# Patient Record
Sex: Male | Born: 1955 | Race: White | Hispanic: No | Marital: Single | Smoking: Current every day smoker
Health system: Southern US, Community
[De-identification: ages and names within clinical notes are randomized; demographics above are authoritative.]

## PROBLEM LIST (undated history)

## (undated) DIAGNOSIS — N289 Disorder of kidney and ureter, unspecified: Secondary | ICD-10-CM

## (undated) DIAGNOSIS — I509 Heart failure, unspecified: Secondary | ICD-10-CM

## (undated) DIAGNOSIS — I10 Essential (primary) hypertension: Secondary | ICD-10-CM

## (undated) HISTORY — PX: TONSILLECTOMY: SUR1361

---

## 2014-01-31 ENCOUNTER — Emergency Department: Payer: Self-pay | Admitting: Emergency Medicine

## 2014-01-31 LAB — BASIC METABOLIC PANEL
Anion Gap: 10 (ref 7–16)
BUN: 14 mg/dL (ref 7–18)
CALCIUM: 7.8 mg/dL — AB (ref 8.5–10.1)
CHLORIDE: 101 mmol/L (ref 98–107)
CO2: 24 mmol/L (ref 21–32)
Creatinine: 0.81 mg/dL (ref 0.60–1.30)
EGFR (African American): >60
EGFR (Non-African Amer.): >60
Glucose: 106 mg/dL — ABNORMAL HIGH (ref 65–99)
Osmolality: 271 (ref 275–301)
Potassium: 3.5 mmol/L (ref 3.5–5.1)
SODIUM: 135 mmol/L — AB (ref 136–145)

## 2014-01-31 LAB — CBC
HCT: 39.2 % — AB (ref 40.0–52.0)
HGB: 13.1 g/dL (ref 13.0–18.0)
MCH: 26.9 pg (ref 26.0–34.0)
MCHC: 33.3 g/dL (ref 32.0–36.0)
MCV: 81 fL (ref 80–100)
Platelet: 391 10*3/uL (ref 150–440)
RBC: 4.86 10*6/uL (ref 4.40–5.90)
RDW: 14.1 % (ref 11.5–14.5)
WBC: 8.5 10*3/uL (ref 3.8–10.6)

## 2014-01-31 LAB — PRO B NATRIURETIC PEPTIDE: B-TYPE NATIURETIC PEPTID: 655 pg/mL — AB (ref 0–125)

## 2014-01-31 LAB — TROPONIN I: Troponin-I: <0.02 ng/mL

## 2018-10-30 ENCOUNTER — Other Ambulatory Visit: Payer: Self-pay

## 2018-10-30 ENCOUNTER — Inpatient Hospital Stay
Admission: EM | Admit: 2018-10-30 | Discharge: 2018-11-09 | DRG: 270 | Disposition: A | Discharge status: Skilled Nursing Facility | Payer: 59 | Attending: Internal Medicine | Admitting: Internal Medicine

## 2018-10-30 ENCOUNTER — Emergency Department: Payer: 59

## 2018-10-30 ENCOUNTER — Encounter: Payer: Self-pay | Admitting: *Deleted

## 2018-10-30 DIAGNOSIS — F17210 Nicotine dependence, cigarettes, uncomplicated: Secondary | ICD-10-CM | POA: Diagnosis present

## 2018-10-30 DIAGNOSIS — Z915 Personal history of self-harm: Secondary | ICD-10-CM | POA: Diagnosis not present

## 2018-10-30 DIAGNOSIS — F332 Major depressive disorder, recurrent severe without psychotic features: Secondary | ICD-10-CM | POA: Diagnosis not present

## 2018-10-30 DIAGNOSIS — T501X5A Adverse effect of loop [high-ceiling] diuretics, initial encounter: Secondary | ICD-10-CM | POA: Diagnosis not present

## 2018-10-30 DIAGNOSIS — Z66 Do not resuscitate: Secondary | ICD-10-CM | POA: Diagnosis present

## 2018-10-30 DIAGNOSIS — F411 Generalized anxiety disorder: Secondary | ICD-10-CM | POA: Diagnosis present

## 2018-10-30 DIAGNOSIS — I96 Gangrene, not elsewhere classified: Secondary | ICD-10-CM | POA: Diagnosis present

## 2018-10-30 DIAGNOSIS — T441X2A Poisoning by other parasympathomimetics [cholinergics], intentional self-harm, initial encounter: Secondary | ICD-10-CM | POA: Diagnosis not present

## 2018-10-30 DIAGNOSIS — Z1159 Encounter for screening for other viral diseases: Secondary | ICD-10-CM

## 2018-10-30 DIAGNOSIS — R7981 Abnormal blood-gas level: Secondary | ICD-10-CM

## 2018-10-30 DIAGNOSIS — I10 Essential (primary) hypertension: Secondary | ICD-10-CM | POA: Diagnosis not present

## 2018-10-30 DIAGNOSIS — I472 Ventricular tachycardia: Secondary | ICD-10-CM | POA: Diagnosis not present

## 2018-10-30 DIAGNOSIS — D649 Anemia, unspecified: Secondary | ICD-10-CM | POA: Diagnosis present

## 2018-10-30 DIAGNOSIS — Y92230 Patient room in hospital as the place of occurrence of the external cause: Secondary | ICD-10-CM | POA: Diagnosis not present

## 2018-10-30 DIAGNOSIS — J849 Interstitial pulmonary disease, unspecified: Secondary | ICD-10-CM | POA: Diagnosis present

## 2018-10-30 DIAGNOSIS — L03115 Cellulitis of right lower limb: Secondary | ICD-10-CM | POA: Diagnosis present

## 2018-10-30 DIAGNOSIS — Z8249 Family history of ischemic heart disease and other diseases of the circulatory system: Secondary | ICD-10-CM

## 2018-10-30 DIAGNOSIS — I110 Hypertensive heart disease with heart failure: Secondary | ICD-10-CM | POA: Diagnosis present

## 2018-10-30 DIAGNOSIS — Z882 Allergy status to sulfonamides status: Secondary | ICD-10-CM | POA: Diagnosis not present

## 2018-10-30 DIAGNOSIS — I5033 Acute on chronic diastolic (congestive) heart failure: Secondary | ICD-10-CM | POA: Diagnosis present

## 2018-10-30 DIAGNOSIS — E876 Hypokalemia: Secondary | ICD-10-CM | POA: Diagnosis not present

## 2018-10-30 DIAGNOSIS — J9601 Acute respiratory failure with hypoxia: Secondary | ICD-10-CM | POA: Diagnosis present

## 2018-10-30 DIAGNOSIS — Z880 Allergy status to penicillin: Secondary | ICD-10-CM | POA: Diagnosis not present

## 2018-10-30 DIAGNOSIS — J189 Pneumonia, unspecified organism: Secondary | ICD-10-CM

## 2018-10-30 DIAGNOSIS — I272 Pulmonary hypertension, unspecified: Secondary | ICD-10-CM | POA: Diagnosis present

## 2018-10-30 DIAGNOSIS — I739 Peripheral vascular disease, unspecified: Principal | ICD-10-CM | POA: Diagnosis present

## 2018-10-30 DIAGNOSIS — I70261 Atherosclerosis of native arteries of extremities with gangrene, right leg: Secondary | ICD-10-CM | POA: Diagnosis not present

## 2018-10-30 DIAGNOSIS — Z716 Tobacco abuse counseling: Secondary | ICD-10-CM | POA: Diagnosis not present

## 2018-10-30 DIAGNOSIS — F329 Major depressive disorder, single episode, unspecified: Secondary | ICD-10-CM | POA: Diagnosis present

## 2018-10-30 DIAGNOSIS — R0602 Shortness of breath: Secondary | ICD-10-CM | POA: Diagnosis not present

## 2018-10-30 DIAGNOSIS — I509 Heart failure, unspecified: Secondary | ICD-10-CM

## 2018-10-30 DIAGNOSIS — Z9114 Patient's other noncompliance with medication regimen: Secondary | ICD-10-CM | POA: Diagnosis not present

## 2018-10-30 DIAGNOSIS — Z95820 Peripheral vascular angioplasty status with implants and grafts: Secondary | ICD-10-CM | POA: Diagnosis not present

## 2018-10-30 DIAGNOSIS — T1491XA Suicide attempt, initial encounter: Secondary | ICD-10-CM | POA: Diagnosis not present

## 2018-10-30 HISTORY — DX: Essential (primary) hypertension: I10

## 2018-10-30 HISTORY — DX: Disorder of kidney and ureter, unspecified: N28.9

## 2018-10-30 HISTORY — DX: Heart failure, unspecified: I50.9

## 2018-10-30 LAB — COMPREHENSIVE METABOLIC PANEL WITH GFR
ALT: 41 U/L (ref 0–44)
AST: 81 U/L — ABNORMAL HIGH (ref 15–41)
Albumin: 2.2 g/dL — ABNORMAL LOW (ref 3.5–5.0)
Alkaline Phosphatase: 97 U/L (ref 38–126)
Anion gap: 12 (ref 5–15)
BUN: 14 mg/dL (ref 8–23)
CO2: 22 mmol/L (ref 22–32)
Calcium: 7.3 mg/dL — ABNORMAL LOW (ref 8.9–10.3)
Chloride: 100 mmol/L (ref 98–111)
Creatinine, Ser: 0.65 mg/dL (ref 0.61–1.24)
GFR calc Af Amer: >60 mL/min
GFR calc non Af Amer: >60 mL/min
Glucose, Bld: 113 mg/dL — ABNORMAL HIGH (ref 70–99)
Potassium: 2.8 mmol/L — ABNORMAL LOW (ref 3.5–5.1)
Sodium: 134 mmol/L — ABNORMAL LOW (ref 135–145)
Total Bilirubin: 1.2 mg/dL (ref 0.3–1.2)
Total Protein: 6.7 g/dL (ref 6.5–8.1)

## 2018-10-30 LAB — URINALYSIS, COMPLETE (UACMP) WITH MICROSCOPIC
Bacteria, UA: NONE SEEN
Bilirubin Urine: NEGATIVE
Glucose, UA: NEGATIVE mg/dL
Hgb urine dipstick: NEGATIVE
Ketones, ur: NEGATIVE mg/dL
Leukocytes,Ua: NEGATIVE
Nitrite: NEGATIVE
Protein, ur: 30 mg/dL — AB
Specific Gravity, Urine: 1.025 (ref 1.005–1.030)
pH: 5 (ref 5.0–8.0)

## 2018-10-30 LAB — CBC WITH DIFFERENTIAL/PLATELET
Abs Immature Granulocytes: 0.13 K/uL — ABNORMAL HIGH (ref 0.00–0.07)
Basophils Absolute: 0 K/uL (ref 0.0–0.1)
Basophils Relative: 0 %
Eosinophils Absolute: 0 K/uL (ref 0.0–0.5)
Eosinophils Relative: 0 %
HCT: 33.9 % — ABNORMAL LOW (ref 39.0–52.0)
Hemoglobin: 10.9 g/dL — ABNORMAL LOW (ref 13.0–17.0)
Immature Granulocytes: 1 %
Lymphocytes Relative: 12 %
Lymphs Abs: 1.6 K/uL (ref 0.7–4.0)
MCH: 27.9 pg (ref 26.0–34.0)
MCHC: 32.2 g/dL (ref 30.0–36.0)
MCV: 86.7 fL (ref 80.0–100.0)
Monocytes Absolute: 0.7 K/uL (ref 0.1–1.0)
Monocytes Relative: 5 %
Neutro Abs: 10.5 K/uL — ABNORMAL HIGH (ref 1.7–7.7)
Neutrophils Relative %: 82 %
Platelets: 282 K/uL (ref 150–400)
RBC: 3.91 MIL/uL — ABNORMAL LOW (ref 4.22–5.81)
RDW: 14.6 % (ref 11.5–15.5)
WBC: 12.9 K/uL — ABNORMAL HIGH (ref 4.0–10.5)
nRBC: 0 % (ref 0.0–0.2)

## 2018-10-30 LAB — LACTIC ACID, PLASMA: Lactic Acid, Venous: 1.9 mmol/L (ref 0.5–1.9)

## 2018-10-30 LAB — PROTIME-INR
INR: 1.2 (ref 0.8–1.2)
Prothrombin Time: 15 seconds (ref 11.4–15.2)

## 2018-10-30 LAB — BRAIN NATRIURETIC PEPTIDE: B Natriuretic Peptide: 1235 pg/mL — ABNORMAL HIGH (ref 0.0–100.0)

## 2018-10-30 LAB — APTT: aPTT: 38 seconds — ABNORMAL HIGH (ref 24–36)

## 2018-10-30 MED ORDER — VANCOMYCIN HCL 10 G IV SOLR
2000.0000 mg | Freq: Once | INTRAVENOUS | Status: AC
  Administered 2018-10-30: 2000 mg via INTRAVENOUS
  Filled 2018-10-30: qty 2000

## 2018-10-30 MED ORDER — TRAMADOL HCL 50 MG PO TABS
50.0000 mg | ORAL_TABLET | Freq: Four times a day (QID) | ORAL | Status: DC | PRN
  Administered 2018-10-31 – 2018-11-09 (×23): 50 mg via ORAL
  Filled 2018-10-30 (×25): qty 1

## 2018-10-30 MED ORDER — ACETAMINOPHEN 325 MG PO TABS
650.0000 mg | ORAL_TABLET | Freq: Four times a day (QID) | ORAL | Status: DC | PRN
  Administered 2018-10-31: 650 mg via ORAL
  Filled 2018-10-30: qty 2

## 2018-10-30 MED ORDER — FUROSEMIDE 20 MG PO TABS: 20.0000 mg | ORAL_TABLET | Freq: Every day | ORAL | Status: DC

## 2018-10-30 MED ORDER — HEPARIN (PORCINE) 25000 UT/250ML-% IV SOLN
2000.0000 [IU]/h | INTRAVENOUS | Status: DC
  Administered 2018-10-30: 1150 [IU]/h via INTRAVENOUS
  Administered 2018-10-31: 1600 [IU]/h via INTRAVENOUS
  Administered 2018-11-01: 23:00:00 2000 [IU]/h via INTRAVENOUS
  Filled 2018-10-30 (×5): qty 250

## 2018-10-30 MED ORDER — METRONIDAZOLE IN NACL 5-0.79 MG/ML-% IV SOLN
500.0000 mg | Freq: Once | INTRAVENOUS | Status: AC
  Administered 2018-10-30: 500 mg via INTRAVENOUS
  Filled 2018-10-30: qty 100

## 2018-10-30 MED ORDER — SODIUM CHLORIDE 0.9 % IV SOLN
2.0000 g | Freq: Once | INTRAVENOUS | Status: AC
  Administered 2018-10-30: 22:00:00 2 g via INTRAVENOUS
  Filled 2018-10-30: qty 2

## 2018-10-30 MED ORDER — ONDANSETRON HCL 4 MG/2ML IJ SOLN: 4.0000 mg | Freq: Four times a day (QID) | INTRAMUSCULAR | Status: DC | PRN

## 2018-10-30 MED ORDER — POLYETHYLENE GLYCOL 3350 17 G PO PACK: 17.0000 g | PACK | Freq: Every day | ORAL | Status: DC | PRN

## 2018-10-30 MED ORDER — ONDANSETRON HCL 4 MG PO TABS: 4.0000 mg | ORAL_TABLET | Freq: Four times a day (QID) | ORAL | Status: DC | PRN

## 2018-10-30 MED ORDER — SPIRONOLACTONE 25 MG PO TABS: 25.0000 mg | ORAL_TABLET | Freq: Every day | ORAL | Status: DC

## 2018-10-30 MED ORDER — ALBUTEROL SULFATE HFA 108 (90 BASE) MCG/ACT IN AERS
2.0000 | INHALATION_SPRAY | Freq: Once | RESPIRATORY_TRACT | Status: AC
  Administered 2018-10-30: 2 via RESPIRATORY_TRACT
  Filled 2018-10-30: qty 6.7

## 2018-10-30 MED ORDER — HEPARIN BOLUS VIA INFUSION
4000.0000 [IU] | Freq: Once | INTRAVENOUS | Status: AC
  Administered 2018-10-30: 23:00:00 4000 [IU] via INTRAVENOUS
  Filled 2018-10-30: qty 4000

## 2018-10-30 MED ORDER — FUROSEMIDE 10 MG/ML IJ SOLN
60.0000 mg | Freq: Once | INTRAMUSCULAR | Status: DC
  Filled 2018-10-30: qty 8

## 2018-10-30 MED ORDER — VANCOMYCIN HCL IN DEXTROSE 1-5 GM/200ML-% IV SOLN
1000.0000 mg | Freq: Once | INTRAVENOUS | Status: DC
  Filled 2018-10-30: qty 200

## 2018-10-30 MED ORDER — ACETAMINOPHEN 650 MG RE SUPP: 650.0000 mg | Freq: Four times a day (QID) | RECTAL | Status: DC | PRN

## 2018-10-30 MED ORDER — FUROSEMIDE 10 MG/ML IJ SOLN
20.0000 mg | Freq: Two times a day (BID) | INTRAMUSCULAR | Status: DC
  Administered 2018-10-30 – 2018-10-31 (×3): 20 mg via INTRAVENOUS
  Filled 2018-10-30 (×3): qty 4

## 2018-10-30 NOTE — ED Triage Notes (Signed)
Per EMS pt lives home alone and has not had any family contact x 2 months. 2 weeks ago pt states he fell and has not had any follow up and that is when Right foot started to worsen. Today Right foot is necrotic from great toe to 3rd toe. Pt states he lost toenails. Brother came by today when daily caregiver tracked him down on FB. Pt appears to be a decent historian but not concerned about his health

## 2018-10-30 NOTE — Progress Notes (Signed)
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 He is a chronic smoker with suspected peripheral arterial disease. Code status address with patient he wants to be DNR no code. Time spent during discussion:16 mins Enedina Finner, MD

## 2018-10-30 NOTE — H&P (Signed)
[facility] [place] [facility]   PATIENT NAME: Douglas Jarvis    MR#:  [id number]  DATE OF BIRTH:  1955-11-27  DATE OF ADMISSION:  10/30/2018  PRIMARY CARE PHYSICIAN: [name], [name], MD   REQUESTING/REFERRING PHYSICIAN: Roxan Hockeyobinson  CHIEF COMPLAINT:   My foot has turned black for last two weeks HISTORY OF PRESENT ILLNESS:  Douglas PrinceWilliam Lauro  is a 63 y.o. male with a known history of systolic mild congestive heart failure, hypertension comes to the emergency room when a family member notified his brother who came to check on patient found to have right foot gangrene/blackened foot. Patient was brought to the emergency room and was found to have right foot gangrene and congestive heart failure acute on chronic systolic. Patient has stopped taking his Coreg Lasix and spironolactone for last two weeks. He apparently had overdosed on his hold prescription of Coreg which patient reports because he is tired of his quality of life and lifestyle. He did not inform anyone of his overdosing. Patient currently is under IVC by ER physician. Psych consult has been placed.  X-ray of the right foot shows gangrene. Patient received IV vancomycin, Flagyl,cefepime ER.  His blood pressure was a bit on the softer side. Lasix 20 mg was started IV. Patient currently is on oxygen his sats are 94% nearby evaluation. He does not wear oxygen.  He is a heavy smoker.  PAST MEDICAL HISTORY:   Past Medical History:  Diagnosis Date  . CHF (congestive heart failure) (HCC)   . Hypertension   . Renal disorder     PAST SURGICAL HISTOIRY:   Past Surgical History:  Procedure Laterality Date  . TONSILLECTOMY      SOCIAL HISTORY:   Social History   Tobacco Use  . Smoking status: Current Every Day Smoker    Packs/day: 2.00    Years: 40.00    Pack years: 80.00    Types: Cigarettes  . Smokeless tobacco: Never Used  Substance Use Topics  . Alcohol use: Not Currently    FAMILY  HISTORY:  HTN  DRUG ALLERGIES:   Allergies  Allergen Reactions  . Penicillins   . Sulfa Antibiotics     REVIEW OF SYSTEMS:  Review of Systems  Constitutional: Negative for chills, fever and weight loss.  HENT: Negative for ear discharge, ear pain and nosebleeds.   Eyes: Negative for blurred vision, pain and discharge.  Respiratory: Positive for shortness of breath. Negative for sputum production, wheezing and stridor.   Cardiovascular: Positive for orthopnea. Negative for chest pain, palpitations and PND.  Gastrointestinal: Negative for abdominal pain, diarrhea, nausea and vomiting.  Genitourinary: Negative for frequency and urgency.  Musculoskeletal: Negative for back pain and joint pain.  Skin:       Black skin of the right foot  Neurological: Positive for weakness. Negative for sensory change, speech change and focal weakness.  Psychiatric/Behavioral: Negative for depression and hallucinations. The patient is not nervous/anxious.      MEDICATIONS AT HOME:   Prior to Admission medications   Medication Sig Start Date End Date Taking? Authorizing Provider  carvedilol (COREG) 12.5 MG tablet Take 1 tablet by mouth 2 (two) times daily. 10/19/18 10/19/19 Yes [provider]  furosemide (LASIX) 20 MG tablet Take 1 tablet by mouth daily. 10/19/18  Yes [provider]  spironolactone (ALDACTONE) 25 MG tablet Take 1 tablet by mouth daily. 10/19/18  Yes [provider]      VITAL SIGNS:  Blood pressure Marland Kitchen(!)  98/59, pulse 84, temperature 99.6 F (37.6 C), temperature source Oral, resp. rate (!) 24, height 5\' 10"  (1.778 m), weight 79.4 kg, SpO2 95 %.  PHYSICAL EXAMINATION:  GENERAL:  63 y.o.-year-old patient lying in the bed with no acute distress disheveled, poor hygiene EYES: Pupils equal, round, reactive to light and accommodation. No scleral icterus. Extraocular muscles intact.  HEENT: Head atraumatic, normocephalic. Oropharynx and nasopharynx clear.  NECK:   Supple, no jugular venous distention. No thyroid enlargement, no tenderness.  LUNGS: decreased breath sounds bilaterally, no wheezing, rales,rhonchi or crepitation. No use of accessory muscles of respiration.  CARDIOVASCULAR: S1, S2 normal. No murmurs, rubs, or gallops.  ABDOMEN: Soft, nontender, nondistended. Bowel sounds present. No organomegaly or mass.  EXTREMITIES: right foot gangrene     NEUROLOGIC: Cranial nerves II through XII are intact. Muscle strength 5/5 in all extremities. Sensation intact. Gait not checked.  PSYCHIATRIC: The patient is alert and oriented x 3.  SKIN: as above  LABORATORY PANEL:   CBC Recent Labs  Lab 10/30/18 2046  WBC 12.9*  HGB 10.9*  HCT 33.9*  PLT 282   ------------------------------------------------------------------------------------------------------------------  Chemistries  Recent Labs  Lab 10/30/18 2046  NA 134*  K 2.8*  CL 100  CO2 22  GLUCOSE 113*  BUN 14  CREATININE 0.65  CALCIUM 7.3*  AST 81*  ALT 41  ALKPHOS 97  BILITOT 1.2   ------------------------------------------------------------------------------------------------------------------  Cardiac Enzymes No results for input(s): TROPONINI in the last 168 hours. ------------------------------------------------------------------------------------------------------------------  RADIOLOGY:  Dg Chest Port 1 View  Result Date: 10/30/2018 CLINICAL DATA:  63 y/o M; cough, shortness of breath, sepsis, evaluate for pneumonia. History of congestive heart failure, hypertension, and smoking. EXAM: PORTABLE CHEST 1 VIEW COMPARISON:  None. FINDINGS: Diffuse reticular and patchy opacities of the lungs. Emphysema with upper lobe predominance. No pneumothorax. Blunting of left costal diaphragmatic angle. Stable cardiac silhouette given projection and technique. Aortic calcific atherosclerosis. Bones are unremarkable. IMPRESSION: Diffuse reticular and patchy opacities of the lungs may  reflect atypical pneumonia or pneumonitis. Small left pleural effusion. Electronically Signed   By: Mitzi Hansen M.D.   On: 10/30/2018 21:16   Dg Foot Complete Right  Result Date: 10/30/2018 CLINICAL DATA:  Right foot necrosis. EXAM: RIGHT FOOT COMPLETE - 3+ VIEW COMPARISON:  None. FINDINGS: There is no evidence of fracture or dislocation. No definite lytic destruction is seen to suggest osteomyelitis. Moderate degenerative changes seen involving the first metatarsophalangeal joint. There appears to be soft tissue gas around the second middle and distal phalanges concerning for cellulitis or ulceration. IMPRESSION: Soft tissue gas is seen involving the distal portion of the second toe concerning for cellulitis or open wound. No definite lytic destruction is seen to suggest osteomyelitis. Electronically Signed   By: Lupita Raider, M.D.   On: 10/30/2018 21:16    EKG:    IMPRESSION AND PLAN:    Baptiste Odens  is a 63 y.o. male with a known history of systolic mild congestive heart failure, hypertension comes to the emergency room when a family member notified his brother who came to check on patient found to have right foot gangrene/blackened foot. Patient was brought to the emergency room and was found to have right foot gangrene and congestive heart failure acute on chronic systolic.  1. Acute on chronic congestive heart failure systolic -EF of 50% by last echo done as outpatient at [facility] clinic -cardiology consultation with Dr. Gwen Pounds-- haiku message sent -Lasix 20 mg IV BID -continue spironolactone -start Coreg  3.125 BID once blood pressure improved. -Patient has issues with noncompliance  2. Right foot gangrene with history of peripheral arterial disease and heavy tobacco abuse -IV heparin drip -IV broad-spectrum antibiotic--- pharmacy to dose -vascular consultation placed for Dr. Evie Lacks and dew  3. Suicidal ideation with attempt of overdose with Coreg two weeks ago  -psychiatry consultation placed message sent -patient is under IVC by ER physician  4. DVT prophylaxis on IV heparin  5. Elevated white count due to number two   All the records are reviewed and case discussed with ED provider.   CODE STATUS: DNR  TOTAL TIME TAKING CARE OF THIS PATIENT: *50* minutes.    Enedina Finner M.D on 10/30/2018 at 11:17 PM  Between 7am to 6pm - Pager - [phone]  After 6pm go to [email or web address] - password [email or web address] [facility]  [facility]  Office  [phone]  CC: Primary care physician; [name], MD

## 2018-10-30 NOTE — Progress Notes (Addendum)
ANTICOAGULATION CONSULT NOTE - Initial Consult  Pharmacy Consult for heparin Indication: Ischemic right foot  Allergies  Allergen Reactions  . Penicillins   . Sulfa Antibiotics     Patient Measurements: Height: 5\' 10"  (177.8 cm) Weight: 175 lb (79.4 kg) IBW/kg (Calculated) : 73 Heparin Dosing Weight: 79.4 kg  Vital Signs: Temp: 99.6 F (37.6 C) (04/12 2046) Temp Source: Oral (04/12 2046) BP: 93/60 (04/12 2145) Pulse Rate: 86 (04/12 2145)  Labs: Recent Labs    10/30/18 2046  HGB 10.9*  HCT 33.9*  PLT 282  CREATININE 0.65    Estimated Creatinine Clearance: 97.6 mL/min (by C-G formula based on SCr of 0.65 mg/dL).   Medical History: Past Medical History:  Diagnosis Date  . CHF (congestive heart failure) (HCC)   . Hypertension   . Renal disorder     Medications:  Scheduled:  . heparin  4,000 Units Intravenous Once    Assessment: Patient arrives d/t right foot pain is necrotic from great toe to the 3rd toe. Foot suspected to be ischemic so heparin drip being started. No anticoagulation PTA.  Goal of Therapy:  Heparin level 0.3-0.7 units/ml Monitor platelets by anticoagulation protocol: Yes   Plan:  Will bolus heparin 4000 units IV x 1 Will start rate heparin 1150 units/hr  Baseline labs drawn, baseline CBC appears WNL Will monitor daily CBC's and adjust per anti-Xa levels.  Thomasene Ripple, PharmD, BCPS Clinical Pharmacist 10/30/2018

## 2018-10-30 NOTE — ED Notes (Signed)
ED TO INPATIENT HANDOFF REPORT  ED Nurse Name and Phone #:  Candise Bowens [phone]  S Name/Age/Gender Trina Ao Limas 63 y.o. male Room/Bed: ED17A/ED17A  Code Status   Code Status: Not on file  Home/SNF/Other home A&O x 4 Is this baseline? Yes     Chief Complaint [facility] Failure To Thrive  Triage Note Per EMS pt lives home alone and has not had any family contact x 2 months. 2 weeks ago pt states he fell and has not had any follow up and that is when Right foot started to worsen. Today Right foot is necrotic from great toe to 3rd toe. Pt states he lost toenails. Brother came by today when daily caregiver tracked him down on FB. Pt appears to be a decent historian but not concerned about his health   Allergies Allergies  Allergen Reactions  . Penicillins   . Sulfa Antibiotics     Level of Care/Admitting Diagnosis ED Disposition    ED Disposition Condition Comment   Admit  Hospital Area: [facility] [[id number]]  Level of Care: Med-Surg [16]  Diagnosis: Gangrene of right foot [facility]) [[id number]]  Admitting Physician: Joselyn Glassman  Attending Physician: Joselyn Glassman  Estimated length of stay: past midnight tomorrow  Certification:: I certify this patient will need inpatient services for at least 2 midnights  PT Class (Do Not Modify): Inpatient [101]  PT Acc Code (Do Not Modify): Private [1]       B Medical/Surgery History Past Medical History:  Diagnosis Date  . CHF (congestive heart failure) (HCC)   . Hypertension   . Renal disorder    Past Surgical History:  Procedure Laterality Date  . TONSILLECTOMY       A IV Location/Drains/Wounds Patient Lines/Drains/Airways Status   Active Line/Drains/Airways    Name:   Placement date:   Placement time:   Site:   Days:   Peripheral IV 10/30/18 Left Forearm   10/30/18    2039    Forearm   less than 1   Peripheral IV 10/30/18 Right Antecubital   10/30/18    2100    Antecubital   less than 1   Peripheral IV 10/30/18 Left Hand   10/30/18    2214    Hand   less than 1   Urethral Catheter Genelle Bal, EDT Straight-tip 14 Fr.   10/30/18    2115    Straight-tip   less than 1          Intake/Output Last 24 hours  Intake/Output Summary (Last 24 hours) at 10/30/2018 2240 Last data filed at 10/30/2018 2126 Gross per 24 hour  Intake -  Output 400 ml  Net -400 ml    Labs/Imaging Results for orders placed or performed during the hospital encounter of 10/30/18 (from the past 48 hour(s))  Lactic acid, plasma     Status: None   Collection Time: 10/30/18  8:46 PM  Result Value Ref Range   Lactic Acid, Venous 1.9 0.5 - 1.9 mmol/L    Comment: Performed at [facility], [address] [place], [place] [address]  Comprehensive metabolic panel     Status: Abnormal   Collection Time: 10/30/18  8:46 PM  Result Value Ref Range   Sodium 134 (L) 135 - 145 mmol/L   Potassium 2.8 (L) 3.5 - 5.1 mmol/L   Chloride 100 98 - 111 mmol/L   CO2 22 22 - 32 mmol/L   Glucose, Bld 113 (H) 70 - 99  mg/dL   BUN 14 8 - 23 mg/dL   Creatinine, Ser 8.46 0.61 - 1.24 mg/dL   Calcium 7.3 (L) 8.9 - 10.3 mg/dL   Total Protein 6.7 6.5 - 8.1 g/dL   Albumin 2.2 (L) 3.5 - 5.0 g/dL   AST 81 (H) 15 - 41 U/L   ALT 41 0 - 44 U/L   Alkaline Phosphatase 97 38 - 126 U/L   Total Bilirubin 1.2 0.3 - 1.2 mg/dL   GFR calc non Af Amer >60 >60 mL/min   GFR calc Af Amer >60 >60 mL/min   Anion gap 12 5 - 15    Comment: Performed at [facility], [address] [place], [place] [address]  CBC WITH DIFFERENTIAL     Status: Abnormal   Collection Time: 10/30/18  8:46 PM  Result Value Ref Range   WBC 12.9 (H) 4.0 - 10.5 K/uL   RBC 3.91 (L) 4.22 - 5.81 MIL/uL   Hemoglobin 10.9 (L) 13.0 - 17.0 g/dL   HCT 28.4 (L) 13.2 - 44.0 %   MCV 86.7 80.0 - 100.0 fL   MCH 27.9 26.0 - 34.0 pg   MCHC 32.2 30.0 - 36.0 g/dL   RDW 10.2 72.5 - 36.6 %   Platelets 282 150 - 400 K/uL   nRBC 0.0 0.0 - 0.2 %   Neutrophils Relative %  82 %   Neutro Abs 10.5 (H) 1.7 - 7.7 K/uL   Lymphocytes Relative 12 %   Lymphs Abs 1.6 0.7 - 4.0 K/uL   Monocytes Relative 5 %   Monocytes Absolute 0.7 0.1 - 1.0 K/uL   Eosinophils Relative 0 %   Eosinophils Absolute 0.0 0.0 - 0.5 K/uL   Basophils Relative 0 %   Basophils Absolute 0.0 0.0 - 0.1 K/uL   Immature Granulocytes 1 %   Abs Immature Granulocytes 0.13 (H) 0.00 - 0.07 K/uL    Comment: Performed at [facility], [address] [place], [place] [address]  Brain natriuretic peptide     Status: Abnormal   Collection Time: 10/30/18  8:46 PM  Result Value Ref Range   B Natriuretic Peptide 1,235.0 (H) 0.0 - 100.0 pg/mL    Comment: Performed at [facility], [address] [place], [place] [address]  Protime-INR     Status: None   Collection Time: 10/30/18  8:46 PM  Result Value Ref Range   Prothrombin Time 15.0 11.4 - 15.2 seconds   INR 1.2 0.8 - 1.2    Comment: (NOTE) INR goal varies based on device and disease states. Performed at [facility], [address] [place], [place] [address]   APTT     Status: Abnormal   Collection Time: 10/30/18  8:46 PM  Result Value Ref Range   aPTT 38 (H) 24 - 36 seconds    Comment:        IF BASELINE aPTT IS ELEVATED, SUGGEST PATIENT RISK ASSESSMENT BE USED TO DETERMINE APPROPRIATE ANTICOAGULANT THERAPY. Performed at [facility], [address] [place], [place] [address]   Urinalysis, Complete w Microscopic     Status: Abnormal   Collection Time: 10/30/18  9:15 PM  Result Value Ref Range   Color, Urine AMBER (A) YELLOW    Comment: BIOCHEMICALS MAY BE AFFECTED BY COLOR   APPearance HAZY (A) CLEAR   Specific Gravity, Urine 1.025 1.005 - 1.030   pH 5.0 5.0 - 8.0   Glucose, UA NEGATIVE NEGATIVE mg/dL   Hgb urine dipstick  NEGATIVE NEGATIVE   Bilirubin Urine NEGATIVE NEGATIVE   Ketones, ur NEGATIVE NEGATIVE mg/dL   Protein, ur 30 (A) NEGATIVE mg/dL   Nitrite NEGATIVE NEGATIVE   Leukocytes,Ua  NEGATIVE NEGATIVE   RBC / HPF 0-5 0 - 5 RBC/hpf   WBC, UA 0-5 0 - 5 WBC/hpf   Bacteria, UA NONE SEEN NONE SEEN   Squamous Epithelial / LPF 0-5 0 - 5   Mucus PRESENT    Hyaline Casts, UA PRESENT     Comment: Performed at [facility], [address], [place], [place] [address]   Dg Chest Port 1 View  Result Date: 10/30/2018 CLINICAL DATA:  63 y/o M; cough, shortness of breath, sepsis, evaluate for pneumonia. History of congestive heart failure, hypertension, and smoking. EXAM: PORTABLE CHEST 1 VIEW COMPARISON:  None. FINDINGS: Diffuse reticular and patchy opacities of the lungs. Emphysema with upper lobe predominance. No pneumothorax. Blunting of left costal diaphragmatic angle. Stable cardiac silhouette given projection and technique. Aortic calcific atherosclerosis. Bones are unremarkable. IMPRESSION: Diffuse reticular and patchy opacities of the lungs may reflect atypical pneumonia or pneumonitis. Small left pleural effusion. Electronically Signed   By: Mitzi Hansen M.D.   On: 10/30/2018 21:16   Dg Foot Complete Right  Result Date: 10/30/2018 CLINICAL DATA:  Right foot necrosis. EXAM: RIGHT FOOT COMPLETE - 3+ VIEW COMPARISON:  None. FINDINGS: There is no evidence of fracture or dislocation. No definite lytic destruction is seen to suggest osteomyelitis. Moderate degenerative changes seen involving the first metatarsophalangeal joint. There appears to be soft tissue gas around the second middle and distal phalanges concerning for cellulitis or ulceration. IMPRESSION: Soft tissue gas is seen involving the distal portion of the second toe concerning for cellulitis or open wound. No definite lytic destruction is seen to suggest osteomyelitis. Electronically Signed   By: Lupita Raider, M.D.   On: 10/30/2018 21:16    Pending Labs Unresulted Labs (From admission, onward)    Start     Ordered   10/31/18 0500  Heparin level (unfractionated)  Tomorrow morning,   STAT      10/30/18 2211   10/30/18 2046  Lactic acid, plasma  STAT Now then every 3 hours,   STAT     10/30/18 2046   10/30/18 2046  Blood Culture (routine x 2)  BLOOD CULTURE X 2,   STAT     10/30/18 2046   10/30/18 2046  Urine culture  ONCE - STAT,   STAT     10/30/18 2046          Vitals/Pain Today's Vitals   10/30/18 2100 10/30/18 2115 10/30/18 2130 10/30/18 2145  BP: (!) 145/132 (!) 87/54 104/63 93/60  Pulse: 89 93 89 86  Resp: (!) 23 (!) 32 (!) 22 (!) 26  Temp:      TempSrc:      SpO2: 93% 95% 100% 92%  Weight:      Height:      PainSc:        Isolation Precautions No active isolations  Medications Medications  vancomycin (VANCOCIN) 2,000 mg in sodium chloride 0.9 % 500 mL IVPB (2,000 mg Intravenous New Bag/Given 10/30/18 2211)  heparin bolus via infusion 4,000 Units (has no administration in time range)  heparin ADULT infusion 100 units/mL (25000 units/212mL sodium chloride 0.45%) (has no administration in time range)  furosemide (LASIX) injection 20 mg (has no administration in time range)  ceFEPIme (MAXIPIME) 2 g in sodium chloride 0.9 % 100 mL IVPB (  0 g Intravenous Stopped 10/30/18 2201)  metroNIDAZOLE (FLAGYL) IVPB 500 mg (500 mg Intravenous New Bag/Given 10/30/18 2132)  albuterol (PROVENTIL HFA;VENTOLIN HFA) 108 (90 Base) MCG/ACT inhaler 2 puff (2 puffs Inhalation Given 10/30/18 2212)    Mobility non-ambulatory High fall risk   Focused Assessments Pulmonary Assessment Handoff:  Lung sounds:   O2 Device: Nasal Cannula(3L)        R Recommendations: See Admitting Provider Note  Report given to:   Additional Notes:  Neurotic Right great toes and toe 2 & 3

## 2018-10-30 NOTE — ED Provider Notes (Addendum)
[facility] Emergency Department Provider Note    First MD Initiated Contact with Patient 10/30/18 2039     (approximate)  I have reviewed the triage vital signs and the nursing notes.   HISTORY  Chief Complaint Fever    HPI Douglas Jarvis is a 63 y.o. male presents the ER for evaluation of shortness of breath cough fever and his right toes turning black over the past 2 weeks.  Patient had intentional overdose where he tried to commit suicide 2 weeks by ingesting "a whole bunch of carvedilol ".  States he stopped taking his medication since then.  He is brought to the ER by his brother calling EMS.  As he does have some pain and discomfort in his right leg.    Past Medical History:  Diagnosis Date  . CHF (congestive heart failure) (HCC)   . Hypertension   . Renal disorder    History reviewed. No pertinent family history.  There are no active problems to display for this patient.     Prior to Admission medications   Medication Sig Start Date End Date Taking? Authorizing Provider  carvedilol (COREG) 12.5 MG tablet Take 1 tablet by mouth 2 (two) times daily. 10/19/18 10/19/19 Yes [provider]  furosemide (LASIX) 20 MG tablet Take 1 tablet by mouth daily. 10/19/18  Yes [provider]  spironolactone (ALDACTONE) 25 MG tablet Take 1 tablet by mouth daily. 10/19/18  Yes [provider]    Allergies Penicillins and Sulfa antibiotics    Social History Social History   Tobacco Use  . Smoking status: Current Every Day Smoker    Packs/day: 2.00    Years: 40.00    Pack years: 80.00    Types: Cigarettes  . Smokeless tobacco: Never Used  Substance Use Topics  . Alcohol use: Not Currently  . Drug use: Never    Review of Systems Patient denies headaches, rhinorrhea, blurry vision, numbness, shortness of breath, chest pain, edema, cough, abdominal pain, nausea, vomiting, diarrhea, dysuria, fevers, rashes or hallucinations  unless otherwise stated above in HPI. ____________________________________________   PHYSICAL EXAM:  VITAL SIGNS: Vitals:   10/30/18 2130 10/30/18 2145  BP: 104/63 93/60  Pulse: 89 86  Resp: (!) 22 (!) 26  Temp:    SpO2: 100% 92%    Constitutional: Alert and oriented, tachypnea with use of accessory muscles.  Eyes: Conjunctivae are normal.  Head: Atraumatic. Nose: No congestion/rhinnorhea. Mouth/Throat: Mucous membranes are moist.   Neck: No stridor. Painless ROM.  Cardiovascular: Normal rate, regular rhythm. Grossly normal heart sounds.  Gangrenous change to right lower extremity.  Delayed cap refill does have some surrounding erythema from the gangrenous changes of the great toe and first 3 minor toes.  No crepitus.  Does have biphasic Doppler signal to the right PT.  Absent DP.  Triphasic popliteal on the right. Respiratory: mild tachypnea, posterior crackles Gastrointestinal: Soft and nontender. No distention. No abdominal bruits. No CVA tenderness. Genitourinary: deferred Musculoskeletal: Gangrene to the right foot great toe monitor toes..  No joint effusions. Neurologic:  Normal speech and language. No gross focal neurologic deficits are appreciated. No facial droop Skin:  Skin is warm, dry and intact. No rash noted. Psychiatric: Mood and affect are normal. Speech and behavior are normal.  admitts to [facility]  ____________________________________________   LABS (all labs ordered are listed, but only abnormal results are displayed)  Results for orders placed or performed during the hospital encounter of 10/30/18 (from the  past 24 hour(s))  Lactic acid, plasma     Status: None   Collection Time: 10/30/18  8:46 PM  Result Value Ref Range   Lactic Acid, Venous 1.9 0.5 - 1.9 mmol/L  Comprehensive metabolic panel     Status: Abnormal   Collection Time: 10/30/18  8:46 PM  Result Value Ref Range   Sodium 134 (L) 135 - 145 mmol/L   Potassium 2.8 (L) 3.5 - 5.1 mmol/L   Chloride  100 98 - 111 mmol/L   CO2 22 22 - 32 mmol/L   Glucose, Bld 113 (H) 70 - 99 mg/dL   BUN 14 8 - 23 mg/dL   Creatinine, Ser 1.61 0.61 - 1.24 mg/dL   Calcium 7.3 (L) 8.9 - 10.3 mg/dL   Total Protein 6.7 6.5 - 8.1 g/dL   Albumin 2.2 (L) 3.5 - 5.0 g/dL   AST 81 (H) 15 - 41 U/L   ALT 41 0 - 44 U/L   Alkaline Phosphatase 97 38 - 126 U/L   Total Bilirubin 1.2 0.3 - 1.2 mg/dL   GFR calc non Af Amer >60 >60 mL/min   GFR calc Af Amer >60 >60 mL/min   Anion gap 12 5 - 15  CBC WITH DIFFERENTIAL     Status: Abnormal   Collection Time: 10/30/18  8:46 PM  Result Value Ref Range   WBC 12.9 (H) 4.0 - 10.5 K/uL   RBC 3.91 (L) 4.22 - 5.81 MIL/uL   Hemoglobin 10.9 (L) 13.0 - 17.0 g/dL   HCT 09.6 (L) 04.5 - 40.9 %   MCV 86.7 80.0 - 100.0 fL   MCH 27.9 26.0 - 34.0 pg   MCHC 32.2 30.0 - 36.0 g/dL   RDW 81.1 91.4 - 78.2 %   Platelets 282 150 - 400 K/uL   nRBC 0.0 0.0 - 0.2 %   Neutrophils Relative % 82 %   Neutro Abs 10.5 (H) 1.7 - 7.7 K/uL   Lymphocytes Relative 12 %   Lymphs Abs 1.6 0.7 - 4.0 K/uL   Monocytes Relative 5 %   Monocytes Absolute 0.7 0.1 - 1.0 K/uL   Eosinophils Relative 0 %   Eosinophils Absolute 0.0 0.0 - 0.5 K/uL   Basophils Relative 0 %   Basophils Absolute 0.0 0.0 - 0.1 K/uL   Immature Granulocytes 1 %   Abs Immature Granulocytes 0.13 (H) 0.00 - 0.07 K/uL  Urinalysis, Complete w Microscopic     Status: Abnormal   Collection Time: 10/30/18  9:15 PM  Result Value Ref Range   Color, Urine AMBER (A) YELLOW   APPearance HAZY (A) CLEAR   Specific Gravity, Urine 1.025 1.005 - 1.030   pH 5.0 5.0 - 8.0   Glucose, UA NEGATIVE NEGATIVE mg/dL   Hgb urine dipstick NEGATIVE NEGATIVE   Bilirubin Urine NEGATIVE NEGATIVE   Ketones, ur NEGATIVE NEGATIVE mg/dL   Protein, ur 30 (A) NEGATIVE mg/dL   Nitrite NEGATIVE NEGATIVE   Leukocytes,Ua NEGATIVE NEGATIVE   RBC / HPF 0-5 0 - 5 RBC/hpf   WBC, UA 0-5 0 - 5 WBC/hpf   Bacteria, UA NONE SEEN NONE SEEN   Squamous Epithelial / LPF 0-5 0 - 5    Mucus PRESENT    Hyaline Casts, UA PRESENT    ____________________________________________  EKG My review and personal interpretation at Time: 20:43   Indication: sob  Rate: 90  Rhythm: sinus Axis: normal Other: nonspecific st abn, no stemi, nml qt ____________________________________________  RADIOLOGY  I personally reviewed all  radiographic images ordered to evaluate for the above acute complaints and reviewed radiology reports and findings.  These findings were personally discussed with the patient.  Please see medical record for radiology report.  ____________________________________________   PROCEDURES  Procedure(s) performed:  .Critical Care Performed by: Willy Eddy, MD Authorized by: Willy Eddy, MD   Critical care provider statement:    Critical care time (minutes):  30   Critical care time was exclusive of:  Separately billable procedures and treating other patients   Critical care was necessary to treat or prevent imminent or life-threatening deterioration of the following conditions:  Respiratory failure   Critical care was time spent personally by me on the following activities:  Development of treatment plan with patient or surrogate, discussions with consultants, evaluation of patient's response to treatment, examination of patient, obtaining history from patient or surrogate, ordering and performing treatments and interventions, ordering and review of laboratory studies, ordering and review of radiographic studies, pulse oximetry, re-evaluation of patient's condition and review of old charts      Critical Care performed: yes ____________________________________________   INITIAL IMPRESSION / ASSESSMENT AND PLAN / ED COURSE  Pertinent labs & imaging results that were available during my care of the patient were reviewed by me and considered in my medical decision making (see chart for details).   DDX: gangrene, pna, chf, copd, sepsis, aki,  limb ischemia  Douglas Jarvis is a 63 y.o. who presents to the ED with symptoms as described above after intentional overdose and suicide attempt 2 weeks ago now presenting with evidence of gangrenous right foot and acute respiratory failure with hypoxia concerning for CHF.  Possible component of COPD.  Will give albuterol inhaler.  Does have decreased DP pulse.  Popliteal is present.  The patient will be placed on continuous pulse oximetry and telemetry for monitoring.  Laboratory evaluation will be sent to evaluate for the above complaints.     Clinical Course as of Oct 30 2222  Sun Oct 30, 2018  2120 X-ray without any evidence of osteo-.  There is report of soft tissue gas distal second toe with area of already necrotic tissue.  Doubt neck Fash given lack of crepitus more proximally and no evidence of gas at that point.   [PR]  2151 Lactate normal.  No overwhelming criteria for sepsis at this point.  Chest x-ray I feel is more concerning for pulmonary edema..  Will give dose of Lasix.  Given gangrenous foot will also heparinize.  Receiving antibiotics for hospital superimposed pneumonia.  Will discuss case with hospitalist for admission given his multiple problems requiring admission for further medical management.   [PR]  2155 Repeat blood pressure currently low but map at 71.  Will add on BNP but hold off on IV Lasix at this moment.   [PR]    Clinical Course User Index [PR] Willy Eddy, MD    The patient was evaluated in Emergency Department today for the symptoms described in the history of present illness. He/she was evaluated in the context of the global COVID-19 pandemic, which necessitated consideration that the patient might be at risk for infection with the SARS-CoV-2 virus that causes COVID-19. Institutional protocols and algorithms that pertain to the evaluation of patients at risk for COVID-19 are in a state of rapid change based on information released by regulatory bodies  including the CDC and federal and state organizations. These policies and algorithms were followed during the patient's care in the ED.  As part  of my medical decision making, I reviewed the following data within the electronic MEDICAL RECORD NUMBER Nursing notes reviewed and incorporated, Labs reviewed, notes from prior ED visits.   ____________________________________________   FINAL CLINICAL IMPRESSION(S) / ED DIAGNOSES  Final diagnoses:  Gangrene of right foot (HCC)  Acute respiratory failure with hypoxia (HCC)      NEW MEDICATIONS STARTED DURING THIS VISIT:  New Prescriptions   No medications on file     Note:  This document was prepared using Dragon voice recognition software and may include unintentional dictation errors.    Willy Eddyobinson, Erlin Gardella, MD 10/30/18 2155    Willy Eddyobinson, Reesha Debes, MD 10/30/18 2224

## 2018-10-31 ENCOUNTER — Encounter: Payer: Self-pay | Admitting: *Deleted

## 2018-10-31 ENCOUNTER — Encounter
Admission: EM | Disposition: A | Discharge status: Skilled Nursing Facility | Payer: Self-pay | Source: Home / Self Care | Attending: Internal Medicine

## 2018-10-31 ENCOUNTER — Other Ambulatory Visit (INDEPENDENT_AMBULATORY_CARE_PROVIDER_SITE_OTHER): Payer: Self-pay | Admitting: Vascular Surgery

## 2018-10-31 ENCOUNTER — Other Ambulatory Visit: Payer: Self-pay

## 2018-10-31 DIAGNOSIS — F329 Major depressive disorder, single episode, unspecified: Secondary | ICD-10-CM

## 2018-10-31 DIAGNOSIS — F17210 Nicotine dependence, cigarettes, uncomplicated: Secondary | ICD-10-CM

## 2018-10-31 DIAGNOSIS — I70261 Atherosclerosis of native arteries of extremities with gangrene, right leg: Secondary | ICD-10-CM

## 2018-10-31 DIAGNOSIS — I96 Gangrene, not elsewhere classified: Secondary | ICD-10-CM

## 2018-10-31 DIAGNOSIS — I10 Essential (primary) hypertension: Secondary | ICD-10-CM

## 2018-10-31 DIAGNOSIS — T1491XA Suicide attempt, initial encounter: Secondary | ICD-10-CM

## 2018-10-31 HISTORY — PX: PERIPHERAL VASCULAR BALLOON ANGIOPLASTY: CATH118281

## 2018-10-31 HISTORY — PX: LOWER EXTREMITY ANGIOGRAPHY: CATH118251

## 2018-10-31 HISTORY — PX: PERIPHERAL VASCULAR INTERVENTION: CATH118257

## 2018-10-31 LAB — BASIC METABOLIC PANEL
Anion gap: 7 (ref 5–15)
BUN: 13 mg/dL (ref 8–23)
CO2: 23 mmol/L (ref 22–32)
Calcium: 6.8 mg/dL — ABNORMAL LOW (ref 8.9–10.3)
Chloride: 104 mmol/L (ref 98–111)
Creatinine, Ser: 0.68 mg/dL (ref 0.61–1.24)
GFR calc Af Amer: >60 mL/min (ref >60)
GFR calc non Af Amer: >60 mL/min (ref >60)
Glucose, Bld: 115 mg/dL — ABNORMAL HIGH (ref 70–99)
Potassium: 2.8 mmol/L — ABNORMAL LOW (ref 3.5–5.1)
Sodium: 134 mmol/L — ABNORMAL LOW (ref 135–145)

## 2018-10-31 LAB — MAGNESIUM: Magnesium: 1.8 mg/dL (ref 1.7–2.4)

## 2018-10-31 LAB — CBC
HCT: 30.2 % — ABNORMAL LOW (ref 39.0–52.0)
Hemoglobin: 9.7 g/dL — ABNORMAL LOW (ref 13.0–17.0)
MCH: 27.6 pg (ref 26.0–34.0)
MCHC: 32.1 g/dL (ref 30.0–36.0)
MCV: 86 fL (ref 80.0–100.0)
Platelets: 238 10*3/uL (ref 150–400)
RBC: 3.51 MIL/uL — ABNORMAL LOW (ref 4.22–5.81)
RDW: 14.7 % (ref 11.5–15.5)
WBC: 11.5 10*3/uL — ABNORMAL HIGH (ref 4.0–10.5)
nRBC: 0 % (ref 0.0–0.2)

## 2018-10-31 LAB — HEPARIN LEVEL (UNFRACTIONATED)
Heparin Unfractionated: 0.1 IU/mL — ABNORMAL LOW (ref 0.30–0.70)
Heparin Unfractionated: 0.11 IU/mL — ABNORMAL LOW (ref 0.30–0.70)

## 2018-10-31 LAB — LACTIC ACID, PLASMA: Lactic Acid, Venous: 1.9 mmol/L (ref 0.5–1.9)

## 2018-10-31 SURGERY — LOWER EXTREMITY ANGIOGRAPHY
Anesthesia: Moderate Sedation | Laterality: Right

## 2018-10-31 MED ORDER — KETOROLAC TROMETHAMINE 15 MG/ML IJ SOLN
15.0000 mg | Freq: Four times a day (QID) | INTRAMUSCULAR | Status: AC
  Administered 2018-10-31 – 2018-11-04 (×18): 15 mg via INTRAVENOUS
  Filled 2018-10-31 (×18): qty 1

## 2018-10-31 MED ORDER — FENTANYL CITRATE (PF) 100 MCG/2ML IJ SOLN
INTRAMUSCULAR | Status: DC | PRN
  Administered 2018-10-31: 12.5 ug via INTRAVENOUS
  Administered 2018-10-31 (×3): 25 ug via INTRAVENOUS
  Administered 2018-10-31: 12.5 ug via INTRAVENOUS
  Administered 2018-10-31: 50 ug via INTRAVENOUS

## 2018-10-31 MED ORDER — SODIUM CHLORIDE 0.9 % IV SOLN
INTRAVENOUS | Status: DC
  Administered 2018-10-31 – 2018-11-01 (×3): via INTRAVENOUS

## 2018-10-31 MED ORDER — VITAMIN C 500 MG PO TABS
250.0000 mg | ORAL_TABLET | Freq: Two times a day (BID) | ORAL | Status: DC
  Administered 2018-11-01 – 2018-11-09 (×16): 250 mg via ORAL
  Filled 2018-10-31 (×16): qty 1

## 2018-10-31 MED ORDER — SODIUM CHLORIDE 0.9 % IV SOLN
2.0000 g | Freq: Three times a day (TID) | INTRAVENOUS | Status: DC
  Administered 2018-10-31 – 2018-11-03 (×9): 2 g via INTRAVENOUS
  Filled 2018-10-31 (×12): qty 2

## 2018-10-31 MED ORDER — MIDAZOLAM HCL 5 MG/5ML IJ SOLN
INTRAMUSCULAR | Status: AC
  Administered 2018-10-31: 14:00:00
  Filled 2018-10-31: qty 5

## 2018-10-31 MED ORDER — FENTANYL CITRATE (PF) 100 MCG/2ML IJ SOLN
INTRAMUSCULAR | Status: AC
  Administered 2018-10-31: 14:00:00
  Filled 2018-10-31: qty 2

## 2018-10-31 MED ORDER — MIDAZOLAM HCL 2 MG/2ML IJ SOLN
INTRAMUSCULAR | Status: DC | PRN
  Administered 2018-10-31: 1 mg via INTRAVENOUS
  Administered 2018-10-31 (×2): 0.5 mg via INTRAVENOUS
  Administered 2018-10-31: 2 mg via INTRAVENOUS
  Administered 2018-10-31: 1 mg via INTRAVENOUS
  Administered 2018-10-31: 0.5 mg via INTRAVENOUS

## 2018-10-31 MED ORDER — IOHEXOL 300 MG/ML  SOLN
INTRAMUSCULAR | Status: DC | PRN
  Administered 2018-10-31: 15:00:00 90 mL via INTRA_ARTERIAL

## 2018-10-31 MED ORDER — HEPARIN SODIUM (PORCINE) 1000 UNIT/ML IJ SOLN
INTRAMUSCULAR | Status: DC | PRN
  Administered 2018-10-31: 4000 [IU] via INTRAVENOUS

## 2018-10-31 MED ORDER — MIDAZOLAM HCL 2 MG/ML PO SYRP: 8.0000 mg | ORAL_SOLUTION | Freq: Once | ORAL | Status: DC | PRN

## 2018-10-31 MED ORDER — DIPHENHYDRAMINE HCL 50 MG/ML IJ SOLN: 50.0000 mg | Freq: Once | INTRAMUSCULAR | Status: DC | PRN

## 2018-10-31 MED ORDER — HEPARIN SODIUM (PORCINE) 1000 UNIT/ML IJ SOLN
INTRAMUSCULAR | Status: AC
  Administered 2018-10-31: 14:00:00
  Filled 2018-10-31: qty 1

## 2018-10-31 MED ORDER — CARVEDILOL 12.5 MG PO TABS
12.5000 mg | ORAL_TABLET | Freq: Two times a day (BID) | ORAL | Status: DC
  Filled 2018-10-31: qty 1

## 2018-10-31 MED ORDER — IPRATROPIUM-ALBUTEROL 0.5-2.5 (3) MG/3ML IN SOLN
3.0000 mL | Freq: Once | RESPIRATORY_TRACT | Status: DC
  Filled 2018-10-31: qty 3

## 2018-10-31 MED ORDER — VANCOMYCIN HCL 10 G IV SOLR
1250.0000 mg | Freq: Two times a day (BID) | INTRAVENOUS | Status: DC
  Administered 2018-10-31 (×2): 1250 mg via INTRAVENOUS
  Filled 2018-10-31 (×5): qty 1250

## 2018-10-31 MED ORDER — METRONIDAZOLE IN NACL 5-0.79 MG/ML-% IV SOLN
500.0000 mg | Freq: Three times a day (TID) | INTRAVENOUS | Status: DC
  Administered 2018-10-31 – 2018-11-03 (×9): 500 mg via INTRAVENOUS
  Filled 2018-10-31 (×12): qty 100

## 2018-10-31 MED ORDER — HYDROMORPHONE HCL 1 MG/ML IJ SOLN
1.0000 mg | Freq: Once | INTRAMUSCULAR | Status: AC | PRN
  Administered 2018-10-31: 1 mg via INTRAVENOUS
  Filled 2018-10-31: qty 1

## 2018-10-31 MED ORDER — ONDANSETRON HCL 4 MG/2ML IJ SOLN: 4.0000 mg | Freq: Four times a day (QID) | INTRAMUSCULAR | Status: DC | PRN

## 2018-10-31 MED ORDER — SODIUM CHLORIDE 0.9 % IV SOLN: INTRAVENOUS | Status: DC

## 2018-10-31 MED ORDER — HEPARIN BOLUS VIA INFUSION
2500.0000 [IU] | Freq: Once | INTRAVENOUS | Status: AC
  Administered 2018-10-31: 2.5 [IU] via INTRAVENOUS
  Filled 2018-10-31: qty 2500

## 2018-10-31 MED ORDER — FAMOTIDINE 20 MG PO TABS: 40.0000 mg | ORAL_TABLET | Freq: Once | ORAL | Status: DC | PRN

## 2018-10-31 MED ORDER — METHYLPREDNISOLONE SODIUM SUCC 125 MG IJ SOLR: 125.0000 mg | Freq: Once | INTRAMUSCULAR | Status: DC | PRN

## 2018-10-31 MED ORDER — ENSURE MAX PROTEIN PO LIQD
11.0000 [oz_av] | Freq: Two times a day (BID) | ORAL | Status: DC
  Administered 2018-11-01 – 2018-11-08 (×4): 11 [oz_av] via ORAL
  Filled 2018-10-31: qty 330

## 2018-10-31 MED ORDER — OCUVITE-LUTEIN PO CAPS
1.0000 | ORAL_CAPSULE | Freq: Every day | ORAL | Status: DC
  Administered 2018-11-01 – 2018-11-09 (×9): 1 via ORAL
  Filled 2018-10-31 (×9): qty 1

## 2018-10-31 MED ORDER — IPRATROPIUM-ALBUTEROL 0.5-2.5 (3) MG/3ML IN SOLN
RESPIRATORY_TRACT | Status: AC
  Administered 2018-10-31: 12:00:00
  Filled 2018-10-31: qty 3

## 2018-10-31 SURGICAL SUPPLY — 26 items
BALLN LUTONIX 018 5X100X130 (BALLOONS)
BALLN LUTONIX 7X100X130 (BALLOONS) ×3
BALLN LUTONIX DCB 5X100X130 (BALLOONS) ×3
BALLN LUTONIX DCB 5X40X130 (BALLOONS) ×3
BALLOON LUTONIX 018 5X100X130 (BALLOONS) IMPLANT
BALLOON LUTONIX 7X100X130 (BALLOONS) ×2 IMPLANT
BALLOON LUTONIX DCB 5X100X130 (BALLOONS) ×2 IMPLANT
BALLOON LUTONIX DCB 5X40X130 (BALLOONS) ×2 IMPLANT
CANISTER PENUMBRA ENGINE (MISCELLANEOUS) ×3 IMPLANT
CATH BEACON 5 .035 65 KMP TIP (CATHETERS) ×3 IMPLANT
CATH INDIGO CAT6 KIT (CATHETERS) ×3 IMPLANT
CATH PIG 70CM (CATHETERS) ×3 IMPLANT
COVER PROBE U/S 5X48 (MISCELLANEOUS) ×3 IMPLANT
DEVICE PRESTO INFLATION (MISCELLANEOUS) ×3 IMPLANT
DEVICE SAFEGUARD 24CM (GAUZE/BANDAGES/DRESSINGS) ×3 IMPLANT
DEVICE STARCLOSE SE CLOSURE (Vascular Products) ×3 IMPLANT
GLIDEWIRE ADV .035X260CM (WIRE) ×3 IMPLANT
PACK ANGIOGRAPHY (CUSTOM PROCEDURE TRAY) ×3 IMPLANT
SHEATH ANL2 6FRX45 HC (SHEATH) ×3 IMPLANT
SHEATH BRITE TIP 5FRX11 (SHEATH) ×3 IMPLANT
SHEATH PINNACLE ST 6F 45CM (SHEATH) ×3 IMPLANT
STENT VIABAHN 6X25X120 (Permanent Stent) ×3 IMPLANT
SYR MEDRAD MARK 7 150ML (SYRINGE) ×3 IMPLANT
TUBING CONTRAST HIGH PRESS 72 (TUBING) ×3 IMPLANT
WIRE G V18X300CM (WIRE) ×3 IMPLANT
WIRE J 3MM .035X145CM (WIRE) ×3 IMPLANT

## 2018-10-31 NOTE — Progress Notes (Addendum)
[facility] - [place] [facility]   PATIENT NAME: Douglas PrinceWilliam Jarvis    MR#:  [id number]  DATE OF BIRTH:  January 18, 1956  SUBJECTIVE:   Patient states he is doing just fine this morning.  He states that his foot is not currently hurting.  He denies any depression or suicidal ideations.  He denies any fevers or chills.  REVIEW OF SYSTEMS:  Review of Systems  Constitutional: Negative for chills and fever.  HENT: Negative for congestion and sore throat.   Eyes: Negative for blurred vision and double vision.  Respiratory: Negative for cough and shortness of breath.   Cardiovascular: Negative for chest pain and palpitations.  Gastrointestinal: Negative for nausea and vomiting.  Genitourinary: Negative for dysuria and urgency.  Musculoskeletal: Negative for back pain, joint pain and neck pain.  Neurological: Negative for dizziness and headaches.  Psychiatric/Behavioral: Negative for depression. The patient is not nervous/anxious.     DRUG ALLERGIES:   Allergies  Allergen Reactions   Penicillins Shortness Of Breath   Sulfa Antibiotics Other (See Comments)    Unsure of reaction as "it has been so long"   VITALS:  Blood pressure 98/70, pulse 80, temperature 98.9 F (37.2 C), temperature source Oral, resp. rate 20, height 5\' 10"  (1.778 m), weight 81.4 kg, SpO2 94 %. PHYSICAL EXAMINATION:  Physical Exam  GENERAL:  Laying in the bed with no acute distress.  HEENT: Head atraumatic, normocephalic. Pupils equal, round, reactive to light and accommodation. No scleral icterus. Extraocular muscles intact. Oropharynx and nasopharynx clear.  NECK:  Supple, no jugular venous distention. No thyroid enlargement. LUNGS: + Faint bibasilar crackles present.  No wheezes or rhonchi. No use of accessory muscles of respiration.  CARDIOVASCULAR: S1, S2 normal. No murmurs, rubs, or gallops.  ABDOMEN: Soft, nontender, nondistended. Bowel sounds present.  EXTREMITIES: No pedal edema,  cyanosis, or clubbing. + Dry gangrene of dorsum of the right foot and the first 3 toes. NEUROLOGIC: CN 2-12 intact, no focal deficits. + Global weakness. sensation intact throughout. Gait not checked.  PSYCHIATRIC: The patient is alert and oriented x 3.  SKIN: No obvious rash, lesion, or ulcer.  LABORATORY PANEL:  Male CBC Recent Labs  Lab 10/31/18 0541  WBC 11.5*  HGB 9.7*  HCT 30.2*  PLT 238   ------------------------------------------------------------------------------------------------------------------ Chemistries  Recent Labs  Lab 10/30/18 2046 10/31/18 0306  NA 134* 134*  K 2.8* 2.8*  CL 100 104  CO2 22 23  GLUCOSE 113* 115*  BUN 14 13  CREATININE 0.65 0.68  CALCIUM 7.3* 6.8*  MG  --  1.8  AST 81*  --   ALT 41  --   ALKPHOS 97  --   BILITOT 1.2  --    RADIOLOGY:  Dg Chest Port 1 View  Result Date: 10/30/2018 CLINICAL DATA:  63 y/o M; cough, shortness of breath, sepsis, evaluate for pneumonia. History of congestive heart failure, hypertension, and smoking. EXAM: PORTABLE CHEST 1 VIEW COMPARISON:  None. FINDINGS: Diffuse reticular and patchy opacities of the lungs. Emphysema with upper lobe predominance. No pneumothorax. Blunting of left costal diaphragmatic angle. Stable cardiac silhouette given projection and technique. Aortic calcific atherosclerosis. Bones are unremarkable. IMPRESSION: Diffuse reticular and patchy opacities of the lungs may reflect atypical pneumonia or pneumonitis. Small left pleural effusion. Electronically Signed   By: Mitzi HansenLance  Furusawa-Stratton M.D.   On: 10/30/2018 21:16   Dg Foot Complete Right  Result Date: 10/30/2018 CLINICAL DATA:  Right foot necrosis. EXAM: RIGHT FOOT COMPLETE -  3+ VIEW COMPARISON:  None. FINDINGS: There is no evidence of fracture or dislocation. No definite lytic destruction is seen to suggest osteomyelitis. Moderate degenerative changes seen involving the first metatarsophalangeal joint. There appears to be soft tissue  gas around the second middle and distal phalanges concerning for cellulitis or ulceration. IMPRESSION: Soft tissue gas is seen involving the distal portion of the second toe concerning for cellulitis or open wound. No definite lytic destruction is seen to suggest osteomyelitis. Electronically Signed   By: Lupita Raider, M.D.   On: 10/30/2018 21:16   ASSESSMENT AND PLAN:   Acute hypoxic respiratory failure secondary to acute on chronic congestive heart failure systolic -EF of 50% by last echo done as outpatient at [facility] -Cardiology consult -Continue Lasix 20 mg IV BID -Hold spironolactone for now in the setting of low blood pressures -Will start beta-blocker as able  Right foot gangrene with history of peripheral arterial disease and heavy tobacco abuse -Continue IV heparin drip -Continue IV broad-spectrum antibiotics -Vascular surgery following-plan for angiogram today  Suicidal ideation with attempt of overdose with Coreg two weeks ago -Patient currently IVC'ed -Psychiatry consult  DVT prophylaxis-Heparin  All the records are reviewed and case discussed with Care Management/Social Worker. Management plans discussed with the patient, family and they are in agreement.  CODE STATUS: DNR  TOTAL TIME TAKING CARE OF THIS PATIENT: 40 minutes.   More than 50% of the time was spent in counseling/coordination of care: YES  POSSIBLE D/C IN 2-3 DAYS, DEPENDING ON CLINICAL CONDITION.   Jinny Blossom [place] Behlke M.D on 10/31/2018 at 3:38 PM  Between 7am to 6pm - Pager - [phone]  After 6pm go to [email or web address] - Social research officer, government  [facility]  Office  [phone]  CC: Primary care physician; [name], MD  Note: This dictation was prepared with Dragon dictation along with smaller phrase technology. Any transcriptional errors that result from this process are unintentional.

## 2018-10-31 NOTE — Consult Note (Signed)
[facility] SPECIAL[facility] Vascular Consult Note  MRN : [id number]  Douglas Jarvis is a 63 y.o. ([birth year]) male who presents with chief complaint of  Chief Complaint  Patient presents with  . Fever   History of Present Illness: The patient is a 63 year old male with a past medical history of renal disorder, hypertension, congestive heart failure, active tobacco abuse, unsuccessful suicide attempt who presented to the [facility] emergency department with a chief complaint of "fever".  Approximately 2 weeks ago the patient tried to commit suicide by ingesting "a whole bunch of carvedilol".  He does not know how many pills he took.  During his emergency room work-up he was found to have gangrenous toes to the right foot.  Patient notes his toes have been "turning black" over the last few weeks.  Patient endorses pain to the right foot otherwise he denies any claudication-like symptoms or rest pain.  Motor/sensory is intact to the fourth and fifth right toe.  Patient notes that when he became febrile his brother called EMS so transported to [facility] for further evaluation.  Vascular Surgery was consulted for possible right lower extremity ischemia Current Facility-Administered Medications  Medication Dose Route Frequency Provider Last Rate Last Dose  . 0.9 %  sodium chloride infusion   Intravenous Continuous Stegmayer, Kimberly A, PA-C      . acetaminophen (TYLENOL) tablet 650 mg  650 mg Oral Q6H PRN Enedina Finner, MD   650 mg at 10/31/18 0309   Or  . acetaminophen (TYLENOL) suppository 650 mg  650 mg Rectal Q6H PRN Enedina Finner, MD      . ceFEPIme (MAXIPIME) 2 g in sodium chloride 0.9 % 100 mL IVPB  2 g Intravenous Q8H Enedina Finner, MD   Stopped at 10/31/18 0604  . furosemide (LASIX) injection 20 mg  20 mg Intravenous Q12H Enedina Finner, MD   20 mg at 10/30/18 2324  . furosemide (LASIX) tablet 20 mg  20 mg Oral Daily Enedina Finner, MD       . heparin ADULT infusion 100 units/mL (25000 units/291mL sodium chloride 0.45%)  1,300 Units/hr Intravenous Continuous Enedina Finner, MD 13 mL/hr at 10/31/18 0815 1,300 Units/hr at 10/31/18 0815  . ketorolac (TORADOL) 15 MG/ML injection 15 mg  15 mg Intravenous Q6H Mansy, Jan A, MD   15 mg at 10/31/18 0309  . metroNIDAZOLE (FLAGYL) IVPB 500 mg  500 mg Intravenous Q8H Enedina Finner, MD   Stopped at 10/31/18 [phone]  . ondansetron (ZOFRAN) tablet 4 mg  4 mg Oral Q6H PRN Enedina Finner, MD       Or  . ondansetron [facility]) injection 4 mg  4 mg Intravenous Q6H PRN Enedina Finner, MD      . polyethylene glycol (MIRALAX / GLYCOLAX) packet 17 g  17 g Oral Daily PRN Enedina Finner, MD      . traMADol Janean Sark) tablet 50 mg  50 mg Oral Q6H PRN Enedina Finner, MD   50 mg at 10/31/18 0716  . vancomycin (VANCOCIN) 1,250 mg in sodium chloride 0.9 % 250 mL IVPB  1,250 mg Intravenous Q12H Enedina Finner, MD       Past Medical History:  Diagnosis Date  . CHF (congestive heart failure) (HCC)   . Hypertension   . Renal disorder    Past Surgical History:  Procedure Laterality Date  . TONSILLECTOMY     Social History Social History   Tobacco Use  . Smoking status: Current Every Day  Smoker    Packs/day: 2.00    Years: 40.00    Pack years: 80.00    Types: Cigarettes  . Smokeless tobacco: Never Used  Substance Use Topics  . Alcohol use: Not Currently  . Drug use: Never   Family History History reviewed. No pertinent family history.  Denies family history of peripheral artery disease, venous disease or bleeding/clotting disorders.  Allergies  Allergen Reactions  . Penicillins   . Sulfa Antibiotics    REVIEW OF SYSTEMS (Negative unless checked)  Constitutional: Weight loss  Fever  Chills Cardiac: Chest pain   Chest pressure   Palpitations   Shortness of breath when laying flat   Shortness of breath at rest   Shortness of breath with exertion. Vascular:  Pain in legs with walking   Pain in legs  at rest   Pain in legs when laying flat   Claudication   Pain in feet when walking  Pain in feet at rest  Pain in feet when laying flat   History of DVT   Phlebitis   Swelling in legs   Varicose veins   Non-healing ulcers Pulmonary:   Uses home oxygen   Productive cough   Hemoptysis   Wheeze  COPD   Asthma Neurologic:  Dizziness  Blackouts   Seizures   History of stroke   History of TIA  Aphasia   Temporary blindness   Dysphagia   Weakness or numbness in arms   Weakness or numbness in legs Musculoskeletal:  Arthritis   Joint swelling   Joint pain   Low back pain Hematologic:  Easy bruising  Easy bleeding   Hypercoagulable state   Anemic  Hepatitis Gastrointestinal:  Blood in stool   Vomiting blood  Gastroesophageal reflux/heartburn   Difficulty swallowing. Genitourinary:  Chronic kidney disease   Difficult urination  Frequent urination  Burning with urination   Blood in urine Skin:  Rashes   Ulcers   Wounds Psychological:  History of anxiety    History of major depression.  Physical Examination  Vitals:   10/30/18 2230 10/30/18 2300 10/30/18 2348 10/31/18 0455  BP: (!) 98/59  103/72 (!) 87/52  Pulse: 85 84 89 74  Resp: (!) 32 (!) 24 20   Temp:   99.1 F (37.3 C) 98.5 F (36.9 C)  TempSrc:   Oral Oral  SpO2: 92% 95% 96% 96%  Weight:   81.4 kg   Height:    (1.778 m)    Body mass index is 25.74 kg/m. Gen:  WD/WN, NAD Head: [place]/AT, No temporalis wasting. Prominent temp pulse not noted. Ear/Nose/Throat: Hearing grossly intact, nares w/o erythema or drainage, oropharynx w/o Erythema/Exudate Eyes: Sclera non-icteric, conjunctiva clear Neck: Trachea midline.  No JVD.  Pulmonary:  Good air movement, respirations not labored, equal bilaterally.  Cardiac: RRR, normal S1, S2. Vascular:  Vessel Right Left  Radial Palpable Palpable  Ulnar Palpable Palpable  Brachial Palpable Palpable   Carotid Palpable, without bruit Palpable, without bruit  Aorta Not palpable N/A  Femoral Palpable Palpable  Popliteal Non-Palpable Palpable  PT Non-Palpable Palpable  DP Non-Palpable Palpable   Right Lower Extremity: thigh soft, calf soft. Extremity warm distally to toes. First through third toes are gangrenous, erythema to toes four to five. Dry gangrene noted - no drainage. Mild cellulitis tracking upwards from toes. Motor / sensory intact to foot / remaining viable toes. Mild not pitting edema.   Gastrointestinal: soft, non-tender/non-distended. No guarding/reflex.  Musculoskeletal: M/S 5/5 throughout.   Neurologic:  Sensation grossly intact in extremities.  Symmetrical.  Speech is fluent. Motor exam as listed above. Psychiatric: Judgment intact, Mood & affect appropriate for pt's clinical situation. Dermatologic:    Lymph : No Cervical, Axillary, or Inguinal lymphadenopathy.  CBC Lab Results  Component Value Date   WBC 11.5 (H) 10/31/2018   HGB 9.7 (L) 10/31/2018   HCT 30.2 (L) 10/31/2018   MCV 86.0 10/31/2018   PLT 238 10/31/2018   BMET    Component Value Date/Time   NA 134 (L) 10/31/2018 0306   NA 135 (L) 01/31/2014 1335   K 2.8 (L) 10/31/2018 0306   K 3.5 01/31/2014 1335   CL 104 10/31/2018 0306   CL 101 01/31/2014 1335   CO2 23 10/31/2018 0306   CO2 24 01/31/2014 1335   GLUCOSE 115 (H) 10/31/2018 0306   GLUCOSE 106 (H) 01/31/2014 1335   BUN 13 10/31/2018 0306   BUN 14 01/31/2014 1335   CREATININE 0.68 10/31/2018 0306   CREATININE 0.81 01/31/2014 1335   CALCIUM 6.8 (L) 10/31/2018 0306   CALCIUM 7.8 (L) 01/31/2014 1335   GFRNONAA >60 10/31/2018 0306   GFRNONAA >60 01/31/2014 1335   GFRAA >60 10/31/2018 0306   GFRAA >60 01/31/2014 1335   Estimated Creatinine Clearance: 97.6 mL/min (by C-G formula based on SCr of 0.68 mg/dL).  COAG Lab Results  Component Value Date   INR 1.2 10/30/2018   Radiology Dg Chest Port 1 View  Result Date: 10/30/2018 CLINICAL  DATA:  63 y/o M; cough, shortness of breath, sepsis, evaluate for pneumonia. History of congestive heart failure, hypertension, and smoking. EXAM: PORTABLE CHEST 1 VIEW COMPARISON:  None. FINDINGS: Diffuse reticular and patchy opacities of the lungs. Emphysema with upper lobe predominance. No pneumothorax. Blunting of left costal diaphragmatic angle. Stable cardiac silhouette given projection and technique. Aortic calcific atherosclerosis. Bones are unremarkable. IMPRESSION: Diffuse reticular and patchy opacities of the lungs may reflect atypical pneumonia or pneumonitis. Small left pleural effusion. Electronically Signed   By: Mitzi HansenLance  Furusawa-Stratton M.D.   On: 10/30/2018 21:16   Dg Foot Complete Right  Result Date: 10/30/2018 CLINICAL DATA:  Right foot necrosis. EXAM: RIGHT FOOT COMPLETE - 3+ VIEW COMPARISON:  None. FINDINGS: There is no evidence of fracture or dislocation. No definite lytic destruction is seen to suggest osteomyelitis. Moderate degenerative changes seen involving the first metatarsophalangeal joint. There appears to be soft tissue gas around the second middle and distal phalanges concerning for cellulitis or ulceration. IMPRESSION: Soft tissue gas is seen involving the distal portion of the second toe concerning for cellulitis or open wound. No definite lytic destruction is seen to suggest osteomyelitis. Electronically Signed   By: Lupita RaiderJames  Green Jr, M.D.   On: 10/30/2018 21:16   Assessment/Plan The patient is a 63 year old male with a past medical history of renal disorder, hypertension, congestive heart failure, active tobacco abuse, unsuccessful suicide attempt who [facility] department with a chief complaint of "fever".  However found to have a right ischemic lower extremity. 1.  Ischemic right lower extremity: On physical exam, there is clearly gangrenous changes to the first, second and third toes.  This is a limb threatening  situation.  Recommend a right lower extremity angiogram with possible intervention and attempt to assess the patient's anatomy and degree of peripheral artery disease. If appropriate an attempt to revascularize the leg can be made to prevent further tissue loss, sepsis or death.  Seizure, risks and benefits explained to the patient.  All questions answered.  The patient wishes to proceed. 2. Depression / Suicide attempt: Patient currently has a one-to-one sitter in his room.  Psychiatry is following. 3. Tobacco Abuse: We had a discussion for approximately five minutes regarding the absolute need for smoking cessation due to the deleterious nature of tobacco on the vascular system. We discussed the tobacco use would diminish patency of any intervention, and likely significantly worsen progressio of disease. We discussed multiple agents for quitting including replacement therapy or medications to reduce cravings such as Chantix. The patient voices their understanding of the importance of smoking cessation. 4. Hypertension: On appropriate medications.  This is followed by his primary care physician/cardiologist.  Discussed with Dr. Weldon Inches, PA-C  10/31/2018 9:23 AM    This note was created with Dragon medical transcription system.  Any error is purely unintentional

## 2018-10-31 NOTE — Consult Note (Signed)
[facility]  Patient ID: Douglas Jarvis, MRN: [id number], DOB/AGE: 03-06-1956 63 y.o. Admit date: 10/30/2018   Date of Consult: 10/31/2018 Primary Physician: [name], [name], MD Primary Cardiologist: Gwen PoundsKowalski  Chief Complaint:  Chief Complaint  Patient presents with  . Fever   Reason for Consult: Heart failure  HPI: 63 y.o. male with known chronic diastolic dysfunction heart failure with occasional shortness of breath and significant long-term chronic pulmonary hypertension and lower extremity edema with anemia and hypertension.  The patient has been on appropriate medication management for these issues with an echocardiogram in the recent past showing an ejection fraction of 50%.  Recently he has continued to have worsening issues with lower extremity edema dependent as well as some severe ischemia and gangrene of the right foot.  The patient has had some suicidal ideation recently due to his significant poor state.  He has fallen as well due to his significant concerns of his foot and imbalance.  He is not able to get around well and this is also exacerbating his foot edema.  The patient has some anemia also contributing at this time.  Chest x-ray has shown pneumonitis and some pleural effusion but no current evidence of myocardial infarction.  Patient will need further intervention and treatment of right foot  Past Medical History:  Diagnosis Date  . CHF (congestive heart failure) (HCC)   . Hypertension   . Renal disorder       Surgical History:  Past Surgical History:  Procedure Laterality Date  . TONSILLECTOMY       Home Meds: Prior to Admission medications   Medication Sig Start Date End Date Taking? Authorizing Provider  carvedilol (COREG) 12.5 MG tablet Take 1 tablet by mouth 2 (two) times daily. 10/19/18 10/19/19 Yes [provider]  furosemide (LASIX) 20 MG tablet Take 1 tablet by mouth daily. 10/19/18  Yes [provider]   spironolactone (ALDACTONE) 25 MG tablet Take 1 tablet by mouth daily. 10/19/18  Yes [provider]    Inpatient Medications:  . fentaNYL      . fentaNYL      . [MAR Hold] furosemide  20 mg Intravenous Q12H  . [MAR Hold] furosemide  20 mg Oral Daily  . heparin      . ipratropium-albuterol  3 mL Nebulization Once  . ipratropium-albuterol      . [MAR Hold] ketorolac  15 mg Intravenous Q6H  . midazolam      . midazolam      . [MAR Hold] multivitamin-lutein  1 capsule Oral Daily  . [MAR Hold] Ensure Max Protein  11 oz Oral BID  . [MAR Hold] vitamin C  250 mg Oral BID   . sodium chloride 75 mL/hr at 10/31/18 1129  . [MAR Hold] ceFEPime (MAXIPIME) IV Stopped (10/31/18 0604)  . heparin 1,300 Units/hr (10/31/18 0815)  . [MAR Hold] metronidazole Stopped (10/31/18 0739)  . [MAR Hold] vancomycin 1,250 mg (10/31/18 1352)    Allergies:  Allergies  Allergen Reactions  . Penicillins Shortness Of Breath  . Sulfa Antibiotics Other (See Comments)    Unsure of reaction as "it has been so long"    Social History   Socioeconomic History  . Marital status: Single    Spouse name: Not on file  . Number of children: Not on file  . Years of education: Not on file  . Highest education level: Not on file  Occupational History  . Not on file  Social Needs  .  Financial resource strain: Not on file  . Food insecurity:    Worry: Not on file    Inability: Not on file  . Transportation needs:    Medical: Not on file    Non-medical: Not on file  Tobacco Use  . Smoking status: Current Every Day Smoker    Packs/day: 2.00    Years: 40.00    Pack years: 80.00    Types: Cigarettes  . Smokeless tobacco: Never Used  Substance and Sexual Activity  . Alcohol use: Not Currently  . Drug use: Never  . Sexual activity: Not Currently  Lifestyle  . Physical activity:    Days per week: Not on file    Minutes per session: Not on file  . Stress: Not on file  Relationships  . Social  connections:    Talks on phone: Not on file    Gets together: Not on file    Attends religious service: Not on file    Active member of club or organization: Not on file    Attends meetings of clubs or organizations: Not on file    Relationship status: Not on file  . Intimate partner violence:    Fear of current or ex partner: Not on file    Emotionally abused: Not on file    Physically abused: Not on file    Forced sexual activity: Not on file  Other Topics Concern  . Not on file  Social History Narrative  . Not on file     History reviewed. No pertinent family history.   Review of Systems Positive for foot pain shortness of breath edema Negative for: General:  chills, fever, night sweats or weight changes.  Cardiovascular: PND orthopnea syncope dizziness  Dermatological skin lesions rashes Respiratory: Cough congestion Urologic: Frequent urination urination at night and hematuria Abdominal: negative for nausea, vomiting, diarrhea, bright red blood per rectum, melena, or hematemesis Neurologic: negative for visual changes, and/or hearing changes  All other systems reviewed and are otherwise negative except as noted above.  Labs: No results for input(s): CKTOTAL, CKMB, TROPONINI in the last 72 hours. Lab Results  Component Value Date   WBC 11.5 (H) 10/31/2018   HGB 9.7 (L) 10/31/2018   HCT 30.2 (L) 10/31/2018   MCV 86.0 10/31/2018   PLT 238 10/31/2018    Recent Labs  Lab 10/30/18 2046 10/31/18 0306  NA 134* 134*  K 2.8* 2.8*  CL 100 104  CO2 22 23  BUN 14 13  CREATININE 0.65 0.68  CALCIUM 7.3* 6.8*  PROT 6.7  --   BILITOT 1.2  --   ALKPHOS 97  --   ALT 41  --   AST 81*  --   GLUCOSE 113* 115*   No results found for: CHOL, HDL, LDLCALC, TRIG No results found for: DDIMER  Radiology/Studies:  Dg Chest Port 1 View  Result Date: 10/30/2018 CLINICAL DATA:  63 y/o M; cough, shortness of breath, sepsis, evaluate for pneumonia. History of congestive heart  failure, hypertension, and smoking. EXAM: PORTABLE CHEST 1 VIEW COMPARISON:  None. FINDINGS: Diffuse reticular and patchy opacities of the lungs. Emphysema with upper lobe predominance. No pneumothorax. Blunting of left costal diaphragmatic angle. Stable cardiac silhouette given projection and technique. Aortic calcific atherosclerosis. Bones are unremarkable. IMPRESSION: Diffuse reticular and patchy opacities of the lungs may reflect atypical pneumonia or pneumonitis. Small left pleural effusion. Electronically Signed   By: Mitzi Hansen M.D.   On: 10/30/2018 21:16   Dg Foot Complete  Right  Result Date: 10/30/2018 CLINICAL DATA:  Right foot necrosis. EXAM: RIGHT FOOT COMPLETE - 3+ VIEW COMPARISON:  None. FINDINGS: There is no evidence of fracture or dislocation. No definite lytic destruction is seen to suggest osteomyelitis. Moderate degenerative changes seen involving the first metatarsophalangeal joint. There appears to be soft tissue gas around the second middle and distal phalanges concerning for cellulitis or ulceration. IMPRESSION: Soft tissue gas is seen involving the distal portion of the second toe concerning for cellulitis or open wound. No definite lytic destruction is seen to suggest osteomyelitis. Electronically Signed   By: Lupita Raider, M.D.   On: 10/30/2018 21:16    EKG: Normal sinus rhythm  Weights: Filed Weights   10/30/18 2049 10/30/18 2348  Weight: 79.4 kg 81.4 kg     Physical Exam: Blood pressure 107/68, pulse 83, temperature 98.9 F (37.2 C), temperature source Oral, resp. rate (!) 23, height 5\' 10"  (1.778 m), weight 81.4 kg, SpO2 93 %. Body mass index is 25.74 kg/m. General: Well developed, well nourished, in no acute distress. Head eyes ears nose throat: Normocephalic, atraumatic, sclera non-icteric, no xanthomas, nares are without discharge. No apparent thyromegaly and/or mass  Lungs: Normal respiratory effort.  no wheezes, few basilar rales, no rhonchi.   Heart: RRR with normal S1 S2. no murmur gallop, no rub, PMI is normal size and placement, carotid upstroke normal without bruit, jugular venous pressure is normal Abdomen: Soft, non-tender, non-distended with normoactive bowel sounds. No hepatomegaly. No rebound/guarding. No obvious abdominal masses. Abdominal aorta is normal size without bruit Extremities: Trace to 1+ edema.  Positive cyanosis, no clubbing,   positive ulcers  Peripheral : 2+ bilateral upper extremity pulses, 2+ bilateral femoral pulses, 0 + bilateral dorsal pedal pulse Neuro: Alert and oriented. No facial asymmetry. No focal deficit. Moves all extremities spontaneously. Musculoskeletal: Normal muscle tone without kyphosis Psych:  Responds to questions appropriately with a normal affect.    Assessment: 63 year old male with acute on chronic diastolic dysfunction congestive heart failure anemia hypertension pneumonitis with a severe chronic pulmonary hypertension and lower extremity edema with gangrene of right foot needing further intervention without current evidence of myocardial infarction  Plan: 1.  Continue gentle diuresis with Lasix for lower extremity edema as well as acute on chronic dysfunction heart failure 2.  Further consideration of updating assessing echocardiogram for worsening LV dysfunction and/or adjustments of medication 3.  Continue current medical regimen for heart failure and hypertension control without change 4.  No restrictions to physical therapy surgery or treatment of right foot gangrene  Signed, Lamar Blinks M.D. [facility] Cardiology 10/31/2018, 3:03 PM

## 2018-10-31 NOTE — Progress Notes (Signed)
Initial Nutrition Assessment  DOCUMENTATION CODES:   Not applicable  INTERVENTION:   Ensure Max protein supplement BID, each supplement provides 150kcal and 30g of protein.  Ocuvite daily for wound healing (provides zinc, vitamin A, vitamin C, Vitamin E, copper, and selenium)  Vitamin C 250mg  po BID  NUTRITION DIAGNOSIS:   Increased nutrient needs related to wound healing as evidenced by increased estimated needs  GOAL:   Patient will meet greater than or equal to 90% of their needs  MONITOR:   PO intake, Supplement acceptance, Labs, Weight trends, Skin, I & O's  REASON FOR ASSESSMENT:   Malnutrition Screening Tool    ASSESSMENT:   63 year old male with a past medical history of renal disorder, hypertension, congestive heart failure, active tobacco abuse, unsuccessful suicide attempt who presented to the [facility] emergency department with a chief complaint of "fever".  Pt found to have a right ischemic lower extremity with gangrene  RD working remotely.  Pt with increased estimated needs r/t wound healing. Pt currently NPO for possible angiogram. RD will add supplements and vitamins to support wound healing. Per chart, pt appears weight stable pta.   Medications reviewed and include: lasix, NaCl @75ml /hr, cefepime, heparin, metronidazole, vancomycin  Labs reviewed: Na 134(L), K 2.8(L), Mg 1.8 wnl BNP- 1235(H)- 4/12 WBC- 11.5(H), Hgb 9.7(L), Hct 30.2(L)   Unable to complete Nutrition-Focused physical exam at this time.   Diet Order:   Diet Order            Diet NPO time specified  Diet effective now             EDUCATION NEEDS:   Not appropriate for education at this time  Skin:  Skin Assessment: Reviewed RN Assessment(gangrene R foot )  Last BM:  4/12  Height:   Ht Readings from Last 1 Encounters:  10/30/18 5\' 10"  (1.778 m)    Weight:   Wt Readings from Last 1 Encounters:  10/30/18 81.4 kg    Ideal Body Weight:   75.4 kg  BMI:  Body mass index is 25.74 kg/m.  Estimated Nutritional Needs:   Kcal:  1900-2200kcal/day   Protein:  90-106g/day  Fluid:  >1.9L/day   Betsey Holiday MS, RD, LDN Pager #- [phone] Office#- [phone] After Hours Pager: [phone]

## 2018-10-31 NOTE — Progress Notes (Signed)
MD paged due to patient having blood pressure reading of 79/48 pulse 60. Patient is afebrile, currently alert and oriented times 4. Asymptomatic.

## 2018-10-31 NOTE — Progress Notes (Signed)
ANTICOAGULATION CONSULT NOTE - Initial Consult  Pharmacy Consult for heparin Indication: Ischemic right foot  Allergies  Allergen Reactions  . Penicillins   . Sulfa Antibiotics     Patient Measurements: Height: 5\' 10"  (177.8 cm) Weight: 179 lb 6.4 oz (81.4 kg) IBW/kg (Calculated) : 73 Heparin Dosing Weight: 79.4 kg  Vital Signs: Temp: 98.5 F (36.9 C) (04/13 0455) Temp Source: Oral (04/13 0455) BP: 87/52 (04/13 0455) Pulse Rate: 74 (04/13 0455)  Labs: Recent Labs    10/30/18 2046 10/31/18 0306  HGB 10.9*  --   HCT 33.9*  --   PLT 282  --   APTT 38*  --   LABPROT 15.0  --   INR 1.2  --   HEPARINUNFRC  --  0.10*  CREATININE 0.65  --     Estimated Creatinine Clearance: 97.6 mL/min (by C-G formula based on SCr of 0.65 mg/dL).   Medical History: Past Medical History:  Diagnosis Date  . CHF (congestive heart failure) (HCC)   . Hypertension   . Renal disorder     Medications:  Scheduled:  . furosemide  20 mg Intravenous Q12H  . furosemide  20 mg Oral Daily  . heparin  2,500 Units Intravenous Once  . ketorolac  15 mg Intravenous Q6H  . spironolactone  25 mg Oral Daily    Assessment: Patient arrives d/t right foot pain is necrotic from great toe to the 3rd toe. Foot suspected to be ischemic so heparin drip being started. No anticoagulation PTA.  Goal of Therapy:  Heparin level 0.3-0.7 units/ml Monitor platelets by anticoagulation protocol: Yes   Plan:  04/13 @ 0300 HL 0.10 subtherapeutic. Will rebolus w/ heparin 2500 units IV x 1 and increase rate to 1300 units/hr and will recheck HL @ 1100.  Thomasene Ripple, PharmD, BCPS Clinical Pharmacist 10/31/2018

## 2018-10-31 NOTE — Progress Notes (Signed)
ANTICOAGULATION CONSULT NOTE  Pharmacy Consult for heparin Indication: Ischemic right foot  Patient Measurements: Height: 5\' 10"  (177.8 cm) Weight: 179 lb 6.4 oz (81.4 kg) IBW/kg (Calculated) : 73 Heparin Dosing Weight: 79.4 kg  Vital Signs: Temp: 97.7 F (36.5 C) (04/13 1601) Temp Source: Oral (04/13 1601) BP: 97/70 (04/13 1601) Pulse Rate: 76 (04/13 1601)  Labs: Recent Labs    10/30/18 2046 10/31/18 0306 10/31/18 0541 10/31/18 1104  HGB 10.9*  --  9.7*  --   HCT 33.9*  --  30.2*  --   PLT 282  --  238  --   APTT 38*  --   --   --   LABPROT 15.0  --   --   --   INR 1.2  --   --   --   HEPARINUNFRC  --  0.10*  --  0.11*  CREATININE 0.65 0.68  --   --     Estimated Creatinine Clearance: 97.6 mL/min (by C-G formula based on SCr of 0.68 mg/dL).   Medical History: Past Medical History:  Diagnosis Date  . CHF (congestive heart failure) (HCC)   . Hypertension   . Renal disorder     Medications:  Scheduled:  . carvedilol  12.5 mg Oral BID  . fentaNYL      . fentaNYL      . furosemide  20 mg Intravenous Q12H  . heparin      . ipratropium-albuterol  3 mL Nebulization Once  . ipratropium-albuterol      . ketorolac  15 mg Intravenous Q6H  . midazolam      . midazolam      . [START ON 11/01/2018] multivitamin-lutein  1 capsule Oral Daily  . [START ON 11/01/2018] Ensure Max Protein  11 oz Oral BID  . [START ON 11/01/2018] vitamin C  250 mg Oral BID    Assessment: Patient arrives d/t right foot pain is necrotic from great toe to the 3rd toe. Foot suspected to be ischemic so heparin drip being started. No anticoagulation PTA. Hgb trending down: will follow-up  Goal of Therapy:  Heparin level 0.3-0.7 units/ml Monitor platelets by anticoagulation protocol: Yes    Heparin Course 4/12 PM initiation: 4000 unit bolus, then 1150 units/hr 4/13 0306 HL 0.10: 2500 unit bolus, rate increased to 1300 units/hr 4/13 1104 HL 0.11: heparin held for approximately 4 hours,  however he was administered 4000 units during the procedure  Plan:  --Increase rate to 1600 units/hr and will recheck HL at midnight --Due to recent invasive procedure I will hold off on a bolus --BKA is scheduled for tomorrow morning. Per Dr Wyn Quaker the heparin is palliative until that can be performed. --CBC in am  Burnis Medin, PharmD Clinical Pharmacist 10/31/2018

## 2018-10-31 NOTE — H&P (Signed)
[facility] History & Physical Update  The patient was interviewed and re-examined.  The patient's previous History and Physical has been reviewed and is unchanged.  There is no change in the plan of care. We plan to proceed with the scheduled procedure.  Festus Barren, MD  10/31/2018, 1:37 PM

## 2018-10-31 NOTE — Progress Notes (Signed)
Patient currently in Cath Lab, SR 6.  Will complete psychiatric consult once he is back in his room later today or tomorrow.  Nanine Means, PMHNP

## 2018-10-31 NOTE — Progress Notes (Signed)
Pharmacy Antibiotic Note  Douglas Jarvis is a 63 y.o. male admitted on 10/30/2018 with pneumonia/necrotic/gangrenous right foot.  Pharmacy has been consulted for vanc/cefepime dosing.  Plan: Patient received vanc 2g IV load, cefepime 2g, and flagyl 500 mg IV x 1  Vancomycin 1250 mg IV Q 12 hrs. Goal AUC 400-550. Expected AUC: 499.5 SCr used: 0.8 Cssmin: 13.6 Will continue cefepime 2g IV q8h   Height: 5\' 10"  (177.8 cm) Weight: 179 lb 6.4 oz (81.4 kg) IBW/kg (Calculated) : 73  Temp (24hrs), Avg:99.1 F (37.3 C), Min:98.5 F (36.9 C), Max:99.6 F (37.6 C)  Recent Labs  Lab 10/30/18 2046 10/31/18 0001  WBC 12.9*  --   CREATININE 0.65  --   LATICACIDVEN 1.9 1.9    Estimated Creatinine Clearance: 97.6 mL/min (by C-G formula based on SCr of 0.65 mg/dL).    Allergies  Allergen Reactions  . Penicillins   . Sulfa Antibiotics     Thank you for allowing pharmacy to be a part of this patient's care.  Thomasene Ripple, PharmD, BCPS Clinical Pharmacist 10/31/2018

## 2018-10-31 NOTE — Op Note (Signed)
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  Ultrasound guidance for vascular access left femoral artery             2.  Catheter placement into right common femoral artery from left femoral approach             3.  Aortogram and selective right lower extremity angiogram             4.  Percutaneous transluminal angioplasty of right profunda femoris artery with 5 mm diameter Lutonix drug-coated angioplasty balloon             5.   Percutaneous transluminal angioplasty of the right external iliac artery and proximal common femoral artery with 7 mm diameter by 10 cm length Lutonix drug-coated angioplasty balloon  6.  Mechanical thrombectomy to the right profunda femoris artery with the penumbra cat 6 device  7.  Viabahn stent placement to the right profunda femoris artery for residual stenosis and thrombus after angioplasty and thrombectomy using a 6 mm diameter by 2.5 cm length stent             8.  StarClose closure device left femoral artery  EBL: 100 cc  Contrast: 90 cc  Fluoro Time: 9 minutes  Moderate Conscious Sedation Time: approximately 60 minutes using 5.5 mg of Versed and 150 Mcg of Fentanyl              Indications:  Patient is a 63 y.o.male with a frankly gangrenous foot. The noninvasive studies at our institution are fully and adequate.  Given the limb threatening nature of the disease, he is brought straight to angiography for diagnostic angiogram with possible concomitant treatment if appropriate.  Due to the limb threatening nature of the situation, angiogram was performed for attempted limb salvage. The patient is aware that if the procedure fails, amputation would be expected.  The patient also understands that even with  successful revascularization, amputation may still be required due to the severity of the situation.  Risks and benefits are discussed and informed consent is obtained.   Procedure:  The patient was identified and appropriate procedural time out was performed.  The patient was then placed supine on the table and prepped and draped in the usual sterile fashion. Moderate conscious sedation was administered during a face to face encounter with the patient throughout the procedure with my supervision of the RN administering medicines and monitoring the patient's vital signs, pulse oximetry, telemetry and mental status throughout from the start of the procedure until the patient was taken to the recovery room. Ultrasound was used to evaluate the left common femoral artery.  It was patent .  A digital ultrasound image was acquired.  A Seldinger needle was used to access the left common femoral artery under direct ultrasound guidance and a permanent image was performed.  A 0.035 J wire was advanced without resistance and a 5Fr sheath was placed.  Pigtail catheter was placed into the aorta and an AP aortogram was performed. This demonstrated normal renal arteries bilaterally.  The aorta was somewhat ectatic.  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  The left external iliac artery had mild calcific plaque but no hemodynamically  significant stenosis.  The right common iliac artery was generous but not aneurysmal and not stenotic.  The right internal iliac artery was widely patent.  The external iliac artery is very calcific with moderate disease that progressed down into the common femoral artery.  The stenosis in the external iliac artery was in the 60% range and at worse at the top of the femoral head the proximal common femoral artery and distal external iliac artery had about a 75 to 80% stenosis. I then crossed the aortic bifurcation and advanced to the right femoral head.  Selective right lower extremity angiogram was then performed. This demonstrated the disease in the common femoral artery.  The superficial femoral artery had a flush occlusion with no visualization of the superficial femoral artery.  The profunda femoris artery was occluded in the main profunda femoris artery just beyond the initial branch and before the primary bifurcation.  The diminished profunda collaterals from the occlusion then faintly fed the distal runoff which appeared to have a posterior tibial and the peroneal artery distally although the imaging was poor and the reconstitution was faint.  There appeared to be reconstitution at the tibioperoneal trunk. It was felt that it was in the patient's best interest to proceed with intervention after these images to avoid a second procedure and a larger amount of contrast and fluoroscopy based off of the findings from the initial angiogram. The patient was systemically heparinized and a 6 [place] [employer] sheath was then placed over the [employer] wire. I then used a Kumpe catheter and the advantage wire to cross the profunda femoris occlusion.  I made this the primary target as his foot was unlikely to be salvageable and a durable reconstruction through the long SFA occlusion was highly unlikely.  At this point, if we could get a prominent profunda femoris artery revascularized with a large collaterals he should heal a below-knee amputation.  After crossing the profunda femoris occlusion and confirming intraluminal flow beyond the primary branches, I elected to perform angioplasty first on the profunda femoris artery and then on the external iliac and common femoral arteries.  The profunda femoris artery was treated with a 5 mm diameter by 10 cm length Lutonix drug-coated angioplasty balloon inflated to 12 atm for 1 minute.  The external iliac artery and proximal common femoral artery were then treated with a 7 mm diameter by 10 cm Lutonix drug-coated  angioplasty balloon inflated to 10 atm for 1 minute.  The external iliac and common femoral artery had less than 30% residual stenosis, but the profunda femoris artery remained occluded with what appeared to be thrombus in the vessel at this point.  I then used the penumbra cat 6 device and exchanged for a 0.018 wire.  The profunda femoris artery was then treated with mechanical thrombectomy using the penumbra cat 6 device.  2 passes were made with about 100 cc of effluent returned and 2 chunks of thrombus removed.  This did provide significant improvement but there remained a greater than 70% residual stenosis and may be a small amount of residual thrombus on that stenosis as well in the profunda femoris artery.  This was in a location where we could place a short covered stent and avoid knocking off any collaterals.  A 6 mm diameter by 2.5 cm length via bond stent was then deployed in the main profunda femoris artery just beyond the initial branch and just before the primary bifurcation of the profunda femoris artery.  This was  done taking care not to occlude any branches.  This was then postdilated with a 5 mm balloon with excellent angiographic completion result and less than 10% residual stenosis.  There was markedly improved flow through the profunda femoris collaterals and I felt he would likely now heal a below-knee amputation.  I elected to terminate the procedure. The sheath was removed and StarClose closure device was deployed in the left femoral artery with excellent hemostatic result. The patient was taken to the recovery room in stable condition having tolerated the procedure well.  Findings:               Aortogram:  Normal renal arteries bilaterally.  The aorta was somewhat ectatic.  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  The left external iliac artery had mild calcific plaque but no hemodynamically significant stenosis.  The right common iliac artery  was generous but not aneurysmal and not stenotic.  The right internal iliac artery was widely patent.  The external iliac artery is very calcific with moderate disease that progressed down into the common femoral artery.  The stenosis in the external iliac artery was in the 60% range and at worse at the top of the femoral head the proximal common femoral artery and distal external iliac artery had about a 75 to 80% stenosis.             Right Lower Extremity:  This demonstrated the disease in the common femoral artery.  The superficial femoral artery had a flush occlusion with no visualization of the superficial femoral artery.  The profunda femoris artery was occluded in the main profunda femoris artery just beyond the initial branch and before the primary bifurcation.  The diminished profunda collaterals from the occlusion then faintly fed the distal runoff which appeared to have a posterior tibial and the peroneal artery distally although the imaging was poor and the reconstitution was faint.  There appeared to be reconstitution at the tibioperoneal trunk.   Disposition: Patient was taken to the recovery room in stable condition having tolerated the procedure well.  Complications: None  Leotis Pain 10/31/2018 2:47 PM   This note was created with Dragon Medical transcription system. Any errors in dictation are purely unintentional.

## 2018-10-31 NOTE — Progress Notes (Signed)
Held coreg due to BP and HR low. Alerted MD.   Madie Reno, RN

## 2018-11-01 ENCOUNTER — Encounter: Payer: Self-pay | Admitting: Vascular Surgery

## 2018-11-01 DIAGNOSIS — I70261 Atherosclerosis of native arteries of extremities with gangrene, right leg: Secondary | ICD-10-CM

## 2018-11-01 DIAGNOSIS — T441X2A Poisoning by other parasympathomimetics [cholinergics], intentional self-harm, initial encounter: Secondary | ICD-10-CM

## 2018-11-01 DIAGNOSIS — Z95820 Peripheral vascular angioplasty status with implants and grafts: Secondary | ICD-10-CM

## 2018-11-01 DIAGNOSIS — F332 Major depressive disorder, recurrent severe without psychotic features: Secondary | ICD-10-CM

## 2018-11-01 LAB — CBC
HCT: 31.2 % — ABNORMAL LOW (ref 39.0–52.0)
Hemoglobin: 9.9 g/dL — ABNORMAL LOW (ref 13.0–17.0)
MCH: 27.6 pg (ref 26.0–34.0)
MCHC: 31.7 g/dL (ref 30.0–36.0)
MCV: 86.9 fL (ref 80.0–100.0)
Platelets: 233 10*3/uL (ref 150–400)
RBC: 3.59 MIL/uL — ABNORMAL LOW (ref 4.22–5.81)
RDW: 14.9 % (ref 11.5–15.5)
WBC: 9.2 10*3/uL (ref 4.0–10.5)
nRBC: 0 % (ref 0.0–0.2)

## 2018-11-01 LAB — URINE CULTURE: Culture: NO GROWTH

## 2018-11-01 LAB — BASIC METABOLIC PANEL
Anion gap: 10 (ref 5–15)
BUN: 24 mg/dL — ABNORMAL HIGH (ref 8–23)
CO2: 21 mmol/L — ABNORMAL LOW (ref 22–32)
Calcium: 6.8 mg/dL — ABNORMAL LOW (ref 8.9–10.3)
Chloride: 104 mmol/L (ref 98–111)
Creatinine, Ser: 1.03 mg/dL (ref 0.61–1.24)
GFR calc Af Amer: >60 mL/min (ref >60)
GFR calc non Af Amer: >60 mL/min (ref >60)
Glucose, Bld: 137 mg/dL — ABNORMAL HIGH (ref 70–99)
Potassium: 2.7 mmol/L — CL (ref 3.5–5.1)
Sodium: 135 mmol/L (ref 135–145)

## 2018-11-01 LAB — HEPARIN LEVEL (UNFRACTIONATED)
Heparin Unfractionated: 0.13 IU/mL — ABNORMAL LOW (ref 0.30–0.70)
Heparin Unfractionated: 0.19 IU/mL — ABNORMAL LOW (ref 0.30–0.70)
Heparin Unfractionated: 0.3 IU/mL (ref 0.30–0.70)
Heparin Unfractionated: 0.36 IU/mL (ref 0.30–0.70)

## 2018-11-01 LAB — MAGNESIUM: Magnesium: 1.7 mg/dL (ref 1.7–2.4)

## 2018-11-01 LAB — TSH: TSH: 4.461 u[IU]/mL (ref 0.350–4.500)

## 2018-11-01 MED ORDER — SODIUM CHLORIDE 0.9 % IV SOLN
INTRAVENOUS | Status: DC | PRN
  Administered 2018-11-01 – 2018-11-02 (×2): 1000 mL via INTRAVENOUS

## 2018-11-01 MED ORDER — SODIUM CHLORIDE 0.9 % IV BOLUS
500.0000 mL | Freq: Once | INTRAVENOUS | Status: AC
  Administered 2018-11-01: 500 mL via INTRAVENOUS

## 2018-11-01 MED ORDER — MAGNESIUM SULFATE 2 GM/50ML IV SOLN
2.0000 g | Freq: Once | INTRAVENOUS | Status: AC
  Administered 2018-11-01: 2 g via INTRAVENOUS
  Filled 2018-11-01: qty 50

## 2018-11-01 MED ORDER — SODIUM CHLORIDE 0.9 % IV SOLN: INTRAVENOUS | Status: DC

## 2018-11-01 MED ORDER — VANCOMYCIN HCL IN DEXTROSE 1-5 GM/200ML-% IV SOLN
1000.0000 mg | Freq: Two times a day (BID) | INTRAVENOUS | Status: DC
  Administered 2018-11-01 – 2018-11-03 (×5): 1000 mg via INTRAVENOUS
  Filled 2018-11-01 (×7): qty 200

## 2018-11-01 MED ORDER — VANCOMYCIN HCL 1000 MG IV SOLR
1000.0000 mg | Freq: Two times a day (BID) | INTRAVENOUS | Status: DC
  Filled 2018-11-01 (×2): qty 1000

## 2018-11-01 MED ORDER — SERTRALINE HCL 50 MG PO TABS
25.0000 mg | ORAL_TABLET | Freq: Every day | ORAL | Status: DC
  Administered 2018-11-01 – 2018-11-03 (×3): 25 mg via ORAL
  Filled 2018-11-01 (×3): qty 1

## 2018-11-01 MED ORDER — POTASSIUM CHLORIDE CRYS ER 20 MEQ PO TBCR
40.0000 meq | EXTENDED_RELEASE_TABLET | ORAL | Status: AC
  Administered 2018-11-01 (×3): 40 meq via ORAL
  Filled 2018-11-01 (×3): qty 2

## 2018-11-01 NOTE — Progress Notes (Signed)
[facility] Daily Progress Note   Subjective: 1 Day Post-Op: 1. Ultrasound guidance for vascular access left femoral artery 2. Catheter placement into right common femoral artery from left femoral approach 3. Aortogram and selective right lower extremity angiogram 4. Percutaneous transluminal angioplasty of right profunda femoris artery with 5 mm diameter Lutonix drug-coated angioplasty balloon 5.  Percutaneous transluminal angioplasty of the right external iliac artery and proximal common femoral artery with 7 mm diameter by 10 cm length Lutonix drug-coated angioplasty balloon             6.  Mechanical thrombectomy to the right profunda femoris artery with the penumbra cat 6 device             7.  Viabahn stent placement to the right profunda femoris artery for residual stenosis and thrombus after angioplasty and thrombectomy using a 6 mm diameter by 2.5 cm length stent 8. StarClose closure device left femoral artery  Patient with some pain to right foot requiring narcotic pain medication for relief.   Objective: Vitals:   10/31/18 2124 10/31/18 2133 10/31/18 2136 11/01/18 0159  BP: (!) 75/54 (!) 140/110 (!) 79/48 (!) 79/53  Pulse: 80 (!) 141 72 78  Resp: (!) 21   (!) 23  Temp:    98.1 F (36.7 C)  TempSrc:    Oral  SpO2:    95%  Weight:      Height:        Intake/Output Summary (Last 24 hours) at 11/01/2018 [id number] Last data filed at 11/01/2018 0800 Gross per 24 hour  Intake 1616.52 ml  Output 1700 ml  Net -83.48 ml   Physical Exam: A&Ox3, NAD CV: RRR Pulmonary: CTA Bilaterally Abdomen: Soft, Nontender, Nondistended Vascular:  Right Lower Extremity: Thigh soft, calf soft. First through third toes necrotic. Dry gangrene.    Laboratory: CBC    Component Value Date/Time   WBC 9.2 11/01/2018 0711   HGB 9.9 (L) 11/01/2018 0711   HGB 13.1 01/31/2014 1335   HCT 31.2 (L)  11/01/2018 0711   HCT 39.2 (L) 01/31/2014 1335   PLT 233 11/01/2018 0711   PLT 391 01/31/2014 1335   BMET    Component Value Date/Time   NA 135 11/01/2018 0711   NA 135 (L) 01/31/2014 1335   K 2.7 (LL) 11/01/2018 0711   K 3.5 01/31/2014 1335   CL 104 11/01/2018 0711   CL 101 01/31/2014 1335   CO2 21 (L) 11/01/2018 0711   CO2 24 01/31/2014 1335   GLUCOSE 137 (H) 11/01/2018 0711   GLUCOSE 106 (H) 01/31/2014 1335   BUN 24 (H) 11/01/2018 0711   BUN 14 01/31/2014 1335   CREATININE 1.03 11/01/2018 0711   CREATININE 0.81 01/31/2014 1335   CALCIUM 6.8 (L) 11/01/2018 0711   CALCIUM 7.8 (L) 01/31/2014 1335   GFRNONAA >60 11/01/2018 0711   GFRNONAA >60 01/31/2014 1335   GFRAA >60 11/01/2018 0711   GFRAA >60 01/31/2014 1335   Assessment/Planning: The patient is a 63 year old male with multiple medical issues including severe PAD of the RLE with gangrene s/p endovascular intervention POD #1 1) Unable to revascularize the patient RLE at the level of the ankle / foot. He is not a candidate for bypass due to the extent of his disease. Recommend a RLE BKA in an attempt to remove the non-viable tissue and prevent sepsis.  Procedure, risks and benefits explained to the patient in detail.  All questions answered.  The patient wishes  to proceed.  We will plan on this with Dr. dew tomorrow morning.  Cleda Daub PA-C 11/01/2018 9:42 AM

## 2018-11-01 NOTE — Progress Notes (Signed)
Pharmacy Antibiotic Note  Douglas Jarvis is a 63 y.o. male admitted on 10/30/2018 with pneumonia/necrotic/gangrenous right foot.  Pharmacy has been consulted for vanc/cefepime dosing - Vanc dosing is being adjusted per bump in Scr.  Previous Vanc dosing: Vancomycin 1250 mg IV Q 12 hrs. Goal AUC 400-550. Expected AUC: 499.5 SCr used: 0.8 Cssmin: 13.6  New Vanc Dosing Vancomycin 1000 mg IV Q 12 hrs. Goal AUC 400-550. Expected AUC: 507 SCr used: 1.03  Will continue cefepime 2g IV q8h   Height: 5\' 10"  (177.8 cm) Weight: 179 lb 6.4 oz (81.4 kg) IBW/kg (Calculated) : 73  Temp (24hrs), Avg:98 F (36.7 C), Min:97.4 F (36.3 C), Max:98.9 F (37.2 C)  Recent Labs  Lab 10/30/18 2046 10/31/18 0001 10/31/18 0306 10/31/18 0541 11/01/18 0711  WBC 12.9*  --   --  11.5* 9.2  CREATININE 0.65  --  0.68  --  1.03  LATICACIDVEN 1.9 1.9  --   --   --     Estimated Creatinine Clearance: 75.8 mL/min (by C-G formula based on SCr of 1.03 mg/dL).    Allergies  Allergen Reactions  . Penicillins Shortness Of Breath  . Sulfa Antibiotics Other (See Comments)    Unsure of reaction as "it has been so long"    Thank you for allowing pharmacy to be a part of this patient's care.  Albina Billet, PharmD, BCPS Clinical Pharmacist 11/01/2018 9:04 AM

## 2018-11-01 NOTE — Progress Notes (Addendum)
ANTICOAGULATION CONSULT NOTE  Pharmacy Consult for heparin Indication: Ischemic right foot  Patient Measurements: Height: 5\' 10"  (177.8 cm) Weight: 179 lb 6.4 oz (81.4 kg) IBW/kg (Calculated) : 73 Heparin Dosing Weight: 79.4 kg  Vital Signs: Temp: 98.1 F (36.7 C) (04/14 0159) Temp Source: Oral (04/14 0159) BP: 79/53 (04/14 0159) Pulse Rate: 78 (04/14 0159)  Labs: Recent Labs    10/30/18 2046  10/31/18 0306 10/31/18 0541 10/31/18 1104 11/01/18 0000 11/01/18 0711  HGB 10.9*  --   --  9.7*  --   --  9.9*  HCT 33.9*  --   --  30.2*  --   --  31.2*  PLT 282  --   --  238  --   --  233  APTT 38*  --   --   --   --   --   --   LABPROT 15.0  --   --   --   --   --   --   INR 1.2  --   --   --   --   --   --   HEPARINUNFRC  --    < > 0.10*  --  0.11* 0.19* 0.13*  CREATININE 0.65  --  0.68  --   --   --  1.03   < > = values in this interval not displayed.    Estimated Creatinine Clearance: 75.8 mL/min (by C-G formula based on SCr of 1.03 mg/dL).   Medical History: Past Medical History:  Diagnosis Date  . CHF (congestive heart failure) (HCC)   . Hypertension   . Renal disorder     Medications:  Scheduled:  . ipratropium-albuterol  3 mL Nebulization Once  . ketorolac  15 mg Intravenous Q6H  . multivitamin-lutein  1 capsule Oral Daily  . potassium chloride  40 mEq Oral Q4H  . Ensure Max Protein  11 oz Oral BID  . vitamin C  250 mg Oral BID    Assessment: Patient arrives d/t right foot pain is necrotic from great toe to the 3rd toe. Foot suspected to be ischemic so heparin drip being started. No anticoagulation PTA. Hgb was trending down now stable: will continue to follow.  Goal of Therapy:  Heparin level 0.3-0.7 units/ml Monitor platelets by anticoagulation protocol: Yes    Heparin Course 4/12 PM initiation: 4000 unit bolus, then 1150 units/hr 4/13 0306 HL 0.10: 2500 unit bolus, rate increased to 1300 units/hr 4/13 1104 HL 0.11: heparin held for approximately  4 hours, however he was administered 4000 units during the procedure 4/14 0000 HL 0.19 Rate increased to 1800 units/hr 4/14 0711 HL 0.13  Plan:  4/14 AM HL 0.13    Level still subtherapeutic. Will increase rate to 2000 units/hr and will recheck HL 6 hours after rate change.   *Heparin drip to continue until 5am 4/15 per vascular*  --CBC in am per protocol   Albina Billet, PharmD, BCPS Clinical Pharmacist 11/01/2018 8:42 AM

## 2018-11-01 NOTE — Consult Note (Signed)
[facility] Face-to-Face Psychiatry Consult   Reason for Consult: Intentional overdose Referring Physician:  Allena Katz Patient Identification: Douglas Jarvis MRN:  [id number] Principal Diagnosis: <principal problem not specified> Diagnosis:  Active Problems:   Gangrene of right foot (HCC)   Total Time spent with patient: 30 minutes  Subjective:   Douglas Jarvis Polhamus is a 63 y.o. male patient admitted with intentional Coreg overdose and cellulitis.  HPI: Patient is seen and examined.  Patient is a 63 year old male with a past psychiatric history significant for congestive heart failure, hypertension, right foot necrosis and peripheral artery disease who presented to the [facility] on 10/30/2018 after an intentional overdose of Coreg.  He was brought to the emergency room by his brother after he had overdosed on Coreg.  He stated at that time he was tired of living, and had been frustrated with the quality of his life.  He stated his mood is been low for at least 2 months.  He stated that he had family support, but he was unable to tolerate this any longer.  He was admitted to the hospital, and he was also found to have a necrotic right foot, which appeared to be gangrenous and cellulitis was also diagnosed.  He is awake and alert and oriented x3.  He denied any suicidal or homicidal ideation at this time.  He stated it was just "stupid moment".  He denied current suicidal ideation.  He denied any crying spells, but did admit to helplessness, hopelessness and worthlessness prior to admission.  He denied any previous psychiatric treatment.  He denied any previous psychiatric admissions.  He denied any previous psychiatric evaluations.  Review of his laboratories revealed hypokalemia, hypocarbia, mildly elevated AST at 81, significantly elevated BNP of 1235.  It does not appear as though TSH is obtained.  Past Psychiatric History: He denied any previous psychiatric admissions, he denied any  previous psychiatric medications, he denied any previous psychiatric admissions.  Risk to Self:   Risk to Others:   Prior Inpatient Therapy:   Prior Outpatient Therapy:    Past Medical History:  Past Medical History:  Diagnosis Date  . CHF (congestive heart failure) (HCC)   . Hypertension   . Renal disorder     Past Surgical History:  Procedure Laterality Date  . LOWER EXTREMITY ANGIOGRAPHY Right 10/31/2018   Procedure: Lower Extremity Angiography;  Surgeon: Annice Needy, MD;  Location: [facility] INVASIVE CV LAB;  Service: Cardiovascular;  Laterality: Right;  . PERIPHERAL VASCULAR BALLOON ANGIOPLASTY  10/31/2018   Procedure: PERIPHERAL VASCULAR BALLOON ANGIOPLASTY;  Surgeon: Annice Needy, MD;  Location: [facility] INVASIVE CV LAB;  Service: Cardiovascular;;  . PERIPHERAL VASCULAR INTERVENTION  10/31/2018   Procedure: PERIPHERAL VASCULAR INTERVENTION;  Surgeon: Annice Needy, MD;  Location: [facility] INVASIVE CV LAB;  Service: Cardiovascular;;  Right profunda femoris  . TONSILLECTOMY     Family History: History reviewed. No pertinent family history. Family Psychiatric  History: He stated that 1 of his parents had bipolar disorder or schizophrenia. Social History:  Social History   Substance and Sexual Activity  Alcohol Use Not Currently     Social History   Substance and Sexual Activity  Drug Use Never    Social History   Socioeconomic History  . Marital status: Single    Spouse name: Not on file  . Number of children: Not on file  . Years of education: Not on file  . Highest education level: Not on file  Occupational History  .  Not on file  Social Needs  . Financial resource strain: Not on file  . Food insecurity:    Worry: Not on file    Inability: Not on file  . Transportation needs:    Medical: Not on file    Non-medical: Not on file  Tobacco Use  . Smoking status: Current Every Day Smoker    Packs/day: 2.00    Years: 40.00    Pack years: 80.00    Types: Cigarettes  .  Smokeless tobacco: Never Used  Substance and Sexual Activity  . Alcohol use: Not Currently  . Drug use: Never  . Sexual activity: Not Currently  Lifestyle  . Physical activity:    Days per week: Not on file    Minutes per session: Not on file  . Stress: Not on file  Relationships  . Social connections:    Talks on phone: Not on file    Gets together: Not on file    Attends religious service: Not on file    Active member of club or organization: Not on file    Attends meetings of clubs or organizations: Not on file    Relationship status: Not on file  Other Topics Concern  . Not on file  Social History Narrative  . Not on file   Additional Social History:    Allergies:   Allergies  Allergen Reactions  . Penicillins Shortness Of Breath  . Sulfa Antibiotics Other (See Comments)    Unsure of reaction as "it has been so long"    Labs:  Results for orders placed or performed during the hospital encounter of 10/30/18 (from the past 48 hour(s))  Lactic acid, plasma     Status: None   Collection Time: 10/30/18  8:46 PM  Result Value Ref Range   Lactic Acid, Venous 1.9 0.5 - 1.9 mmol/L    Comment: Performed at [facility], [address] [place], [place] [address]  Comprehensive metabolic panel     Status: Abnormal   Collection Time: 10/30/18  8:46 PM  Result Value Ref Range   Sodium 134 (L) 135 - 145 mmol/L   Potassium 2.8 (L) 3.5 - 5.1 mmol/L   Chloride 100 98 - 111 mmol/L   CO2 22 22 - 32 mmol/L   Glucose, Bld 113 (H) 70 - 99 mg/dL   BUN 14 8 - 23 mg/dL   Creatinine, Ser 6.04 0.61 - 1.24 mg/dL   Calcium 7.3 (L) 8.9 - 10.3 mg/dL   Total Protein 6.7 6.5 - 8.1 g/dL   Albumin 2.2 (L) 3.5 - 5.0 g/dL   AST 81 (H) 15 - 41 U/L   ALT 41 0 - 44 U/L   Alkaline Phosphatase 97 38 - 126 U/L   Total Bilirubin 1.2 0.3 - 1.2 mg/dL   GFR calc non Af Amer >60 >60 mL/min   GFR calc Af Amer >60 >60 mL/min   Anion gap 12 5 - 15    Comment: Performed at [facility], [address] [place], [place] [address]  CBC WITH DIFFERENTIAL     Status: Abnormal   Collection Time: 10/30/18  8:46 PM  Result Value Ref Range   WBC 12.9 (H) 4.0 - 10.5 K/uL   RBC 3.91 (L) 4.22 - 5.81 MIL/uL   Hemoglobin 10.9 (L) 13.0 - 17.0 g/dL   HCT 11.9 (L) 14.7 - 82.9 %   MCV 86.7 80.0 - 100.0 fL   MCH 27.9 26.0 - 34.0 pg  MCHC 32.2 30.0 - 36.0 g/dL   RDW 40.9 81.1 - 91.4 %   Platelets 282 150 - 400 K/uL   nRBC 0.0 0.0 - 0.2 %   Neutrophils Relative % 82 %   Neutro Abs 10.5 (H) 1.7 - 7.7 K/uL   Lymphocytes Relative 12 %   Lymphs Abs 1.6 0.7 - 4.0 K/uL   Monocytes Relative 5 %   Monocytes Absolute 0.7 0.1 - 1.0 K/uL   Eosinophils Relative 0 %   Eosinophils Absolute 0.0 0.0 - 0.5 K/uL   Basophils Relative 0 %   Basophils Absolute 0.0 0.0 - 0.1 K/uL   Immature Granulocytes 1 %   Abs Immature Granulocytes 0.13 (H) 0.00 - 0.07 K/uL    Comment: Performed at [facility], [address], [place], [place] [address]  Blood Culture (routine x 2)     Status: None (Preliminary result)   Collection Time: 10/30/18  8:46 PM  Result Value Ref Range   Specimen Description BLOOD RIGHT ANTECUBITAL    Special Requests      BOTTLES DRAWN AEROBIC AND ANAEROBIC Blood Culture results may not be optimal due to an excessive volume of blood received in culture bottles   Culture      NO GROWTH 2 DAYS Performed at [facility], [address], [place], [place] [address]    Report Status PENDING   Blood Culture (routine x 2)     Status: None (Preliminary result)   Collection Time: 10/30/18  8:46 PM  Result Value Ref Range   Specimen Description BLOOD LEFT ANTECUBITAL    Special Requests      BOTTLES DRAWN AEROBIC AND ANAEROBIC Blood Culture adequate volume   Culture      NO GROWTH 2 DAYS Performed at [facility], [address], [place], [place] [address]    Report Status PENDING   Brain natriuretic peptide     Status: Abnormal   Collection Time:  10/30/18  8:46 PM  Result Value Ref Range   B Natriuretic Peptide 1,235.0 (H) 0.0 - 100.0 pg/mL    Comment: Performed at [facility], [address] [place], [place] [address]  Protime-INR     Status: None   Collection Time: 10/30/18  8:46 PM  Result Value Ref Range   Prothrombin Time 15.0 11.4 - 15.2 seconds   INR 1.2 0.8 - 1.2    Comment: (NOTE) INR goal varies based on device and disease states. Performed at [facility], [address] [place], [place] [address]   APTT     Status: Abnormal   Collection Time: 10/30/18  8:46 PM  Result Value Ref Range   aPTT 38 (H) 24 - 36 seconds    Comment:        IF BASELINE aPTT IS ELEVATED, SUGGEST PATIENT RISK ASSESSMENT BE USED TO DETERMINE APPROPRIATE ANTICOAGULANT THERAPY. Performed at [facility], [address] [place], [place] [address]   Urine culture     Status: None   Collection Time: 10/30/18  9:15 PM  Result Value Ref Range   Specimen Description      URINE, RANDOM Performed at [facility], [address] [place], [place] [address]    Special Requests      NONE Performed at [facility], [address] [place], [place] [address]    Culture      NO GROWTH Performed at [facility], [address] [place], [place] [address]  Report Status 11/01/2018 FINAL   Urinalysis, Complete w Microscopic     Status: Abnormal   Collection Time: 10/30/18  9:15 PM  Result Value Ref Range   Color, Urine AMBER (A) YELLOW    Comment: BIOCHEMICALS MAY BE AFFECTED BY COLOR   APPearance HAZY (A) CLEAR   Specific Gravity, Urine 1.025 1.005 - 1.030   pH 5.0 5.0 - 8.0   Glucose, UA NEGATIVE NEGATIVE mg/dL   Hgb urine dipstick NEGATIVE NEGATIVE   Bilirubin Urine NEGATIVE NEGATIVE   Ketones, ur NEGATIVE NEGATIVE mg/dL   Protein, ur 30 (A) NEGATIVE mg/dL   Nitrite NEGATIVE NEGATIVE   Leukocytes,Ua NEGATIVE NEGATIVE   RBC / HPF 0-5 0 - 5 RBC/hpf   WBC, UA 0-5 0 - 5 WBC/hpf    Bacteria, UA NONE SEEN NONE SEEN   Squamous Epithelial / LPF 0-5 0 - 5   Mucus PRESENT    Hyaline Casts, UA PRESENT     Comment: Performed at [facility], [address] [place], [place] [address]  Lactic acid, plasma     Status: None   Collection Time: 10/31/18 12:01 AM  Result Value Ref Range   Lactic Acid, Venous 1.9 0.5 - 1.9 mmol/L    Comment: Performed at [facility], [address] [place], [place] [address]  Heparin level (unfractionated)     Status: Abnormal   Collection Time: 10/31/18  3:06 AM  Result Value Ref Range   Heparin Unfractionated 0.10 (L) 0.30 - 0.70 IU/mL    Comment: (NOTE) If heparin results are below expected values, and patient dosage has  been confirmed, suggest follow up testing of antithrombin III levels. Performed at [facility], [address] [place], [place] [address]   Basic metabolic panel     Status: Abnormal   Collection Time: 10/31/18  3:06 AM  Result Value Ref Range   Sodium 134 (L) 135 - 145 mmol/L   Potassium 2.8 (L) 3.5 - 5.1 mmol/L   Chloride 104 98 - 111 mmol/L   CO2 23 22 - 32 mmol/L   Glucose, Bld 115 (H) 70 - 99 mg/dL   BUN 13 8 - 23 mg/dL   Creatinine, Ser 6.38 0.61 - 1.24 mg/dL   Calcium 6.8 (L) 8.9 - 10.3 mg/dL   GFR calc non Af Amer >60 >60 mL/min   GFR calc Af Amer >60 >60 mL/min   Anion gap 7 5 - 15    Comment: Performed at [facility], [address] [place], [place] [address]  Magnesium     Status: None   Collection Time: 10/31/18  3:06 AM  Result Value Ref Range   Magnesium 1.8 1.7 - 2.4 mg/dL    Comment: Performed at [facility], [address] [place], [place] [address]  CBC     Status: Abnormal   Collection Time: 10/31/18  5:41 AM  Result Value Ref Range   WBC 11.5 (H) 4.0 - 10.5 K/uL   RBC 3.51 (L) 4.22 - 5.81 MIL/uL   Hemoglobin 9.7 (L) 13.0 - 17.0 g/dL   HCT 88.4 (L) 16.6 - 06.3 %   MCV 86.0 80.0 - 100.0 fL   MCH 27.6 26.0 - 34.0 pg   MCHC 32.1  30.0 - 36.0 g/dL   RDW 01.6 01.0 - 93.2 %   Platelets 238 150 - 400 K/uL   nRBC 0.0 0.0 - 0.2 %    Comment: Performed at [facility], [address]  [place], [place] [address]  Heparin level (unfractionated)     Status: Abnormal   Collection Time: 10/31/18 11:04 AM  Result Value Ref Range   Heparin Unfractionated 0.11 (L) 0.30 - 0.70 IU/mL    Comment: (NOTE) If heparin results are below expected values, and patient dosage has  been confirmed, suggest follow up testing of antithrombin III levels. Performed at [facility], [address] [place], [place] [address]   Heparin level (unfractionated)     Status: Abnormal   Collection Time: 11/01/18 12:00 AM  Result Value Ref Range   Heparin Unfractionated 0.19 (L) 0.30 - 0.70 IU/mL    Comment: (NOTE) If heparin results are below expected values, and patient dosage has  been confirmed, suggest follow up testing of antithrombin III levels. Performed at [facility], [address] [place], [place] [address]   CBC     Status: Abnormal   Collection Time: 11/01/18  7:11 AM  Result Value Ref Range   WBC 9.2 4.0 - 10.5 K/uL   RBC 3.59 (L) 4.22 - 5.81 MIL/uL   Hemoglobin 9.9 (L) 13.0 - 17.0 g/dL   HCT 91.4 (L) 78.2 - 95.6 %   MCV 86.9 80.0 - 100.0 fL   MCH 27.6 26.0 - 34.0 pg   MCHC 31.7 30.0 - 36.0 g/dL   RDW 21.3 08.6 - 57.8 %   Platelets 233 150 - 400 K/uL   nRBC 0.0 0.0 - 0.2 %    Comment: Performed at [facility], [address] [place], [place] [address]  Basic metabolic panel     Status: Abnormal   Collection Time: 11/01/18  7:11 AM  Result Value Ref Range   Sodium 135 135 - 145 mmol/L   Potassium 2.7 (LL) 3.5 - 5.1 mmol/L    Comment: CRITICAL RESULT CALLED TO, READ BACK BY AND VERIFIED WITH  MISTY BURNS AT 0738 11/01/18 SDR    Chloride 104 98 - 111 mmol/L   CO2 21 (L) 22 - 32 mmol/L   Glucose, Bld 137 (H) 70 - 99 mg/dL   BUN 24 (H) 8 - 23 mg/dL   Creatinine, Ser 9.52 0.61 -  1.24 mg/dL   Calcium 6.8 (L) 8.9 - 10.3 mg/dL   GFR calc non Af Amer >60 >60 mL/min   GFR calc Af Amer >60 >60 mL/min   Anion gap 10 5 - 15    Comment: Performed at [facility], [address] [place], [place] [address]  Magnesium     Status: None   Collection Time: 11/01/18  7:11 AM  Result Value Ref Range   Magnesium 1.7 1.7 - 2.4 mg/dL    Comment: Performed at [facility], [address] [place], [place] [address]  Heparin level (unfractionated)     Status: Abnormal   Collection Time: 11/01/18  7:11 AM  Result Value Ref Range   Heparin Unfractionated 0.13 (L) 0.30 - 0.70 IU/mL    Comment: (NOTE) If heparin results are below expected values, and patient dosage has  been confirmed, suggest follow up testing of antithrombin III levels. Performed at [facility], [address], [place], [place] [address]     Current Facility-Administered Medications  Medication Dose Route Frequency Provider Last Rate Last Dose  . [START ON 11/02/2018] 0.9 %  sodium chloride infusion   Intravenous Continuous Stegmayer, Kimberly A, PA-C      . acetaminophen (TYLENOL) tablet 650 mg  650 mg Oral Q6H PRN Annice Needy,  MD   650 mg at 10/31/18 0309   Or  . acetaminophen (TYLENOL) suppository 650 mg  650 mg Rectal Q6H PRN Annice Needy, MD      . ceFEPIme (MAXIPIME) 2 g in sodium chloride 0.9 % 100 mL IVPB  2 g Intravenous Q8H Annice Needy, MD   Stopped at 11/01/18 0510  . heparin ADULT infusion 100 units/mL (25000 units/226mL sodium chloride 0.45%)  2,000 Units/hr Intravenous Continuous Albina Billet, RPH 20 mL/hr at 11/01/18 1015 2,000 Units/hr at 11/01/18 1015  . ipratropium-albuterol (DUONEB) 0.5-2.5 (3) MG/3ML nebulizer solution 3 mL  3 mL Nebulization Once Annice Needy, MD      . ketorolac (TORADOL) 15 MG/ML injection 15 mg  15 mg Intravenous Q6H Annice Needy, MD   15 mg at 11/01/18 0543  . metroNIDAZOLE (FLAGYL) IVPB 500 mg  500 mg Intravenous Q8H Annice Needy,  MD   Stopped at 11/01/18 [phone]  . multivitamin-lutein (OCUVITE-LUTEIN) capsule 1 capsule  1 capsule Oral Daily Annice Needy, MD   1 capsule at 11/01/18 0820  . ondansetron (ZOFRAN) tablet 4 mg  4 mg Oral Q6H PRN Annice Needy, MD       Or  . ondansetron (ZOFRAN) injection 4 mg  4 mg Intravenous Q6H PRN Annice Needy, MD      . polyethylene glycol (MIRALAX / GLYCOLAX) packet 17 g  17 g Oral Daily PRN Annice Needy, MD      . potassium chloride SA (K-DUR,KLOR-CON) CR tablet 40 mEq  40 mEq Oral Q4H Mayo, Allyn Kenner, MD   40 mEq at 11/01/18 0819  . protein supplement (ENSURE MAX) liquid  11 oz Oral BID Annice Needy, MD      . traMADol Janean Sark) tablet 50 mg  50 mg Oral Q6H PRN Annice Needy, MD   50 mg at 11/01/18 0513  . vancomycin (VANCOCIN) IVPB 1000 mg/200 mL premix  1,000 mg Intravenous Q12H Albina Billet, RPH      . vitamin C (ASCORBIC ACID) tablet 250 mg  250 mg Oral BID Annice Needy, MD   250 mg at 11/01/18 1022    Musculoskeletal: Strength & Muscle Tone: within normal limits Gait & Station: In the hospital bed Patient leans: N/A  Psychiatric Specialty Exam: Physical Exam  Nursing note and vitals reviewed. Constitutional: He is oriented to person, place, and time. He appears well-developed and well-nourished.  HENT:  Head: Normocephalic and atraumatic.  Respiratory: Effort normal.  Neurological: He is alert and oriented to person, place, and time.    ROS  Blood pressure (!) 79/53, pulse 78, temperature 98.1 F (36.7 C), temperature source Oral, resp. rate (!) 23, height 5\' 10"  (1.778 m), weight 81.4 kg, SpO2 95 %.Body mass index is 25.74 kg/m.  General Appearance: Disheveled  Eye Contact:  Good  Speech:  Normal Rate  Volume:  Normal  Mood:  Anxious  Affect:  Congruent  Thought Process:  Coherent and Descriptions of Associations: Intact  Orientation:  Full (Time, Place, and Person)  Thought Content:  Logical  Suicidal Thoughts:  No  Homicidal Thoughts:  No  Memory:   Immediate;   Fair Recent;   Fair Remote;   Fair  Judgement:  Intact  Insight:  Fair  Psychomotor Activity:  Decreased  Concentration:  Concentration: Fair and Attention Span: Fair  Recall:  [employer] of Knowledge:  Fair  Language:  Fair  Akathisia:  Negative  Handed:  Right  AIMS (if indicated):     Assets:  Desire for Improvement Resilience  ADL's:  Intact  Cognition:  WNL  Sleep:        Treatment Plan Summary: Daily contact with patient to assess and evaluate symptoms and progress in treatment, Medication management and Plan : Patient is seen and examined.  Patient is a 63 year old male with the above-stated past psychiatric history.  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  He currently regrets his overdose.  He wants to live and do well.  He has no history of substance issues outside of tobacco.  He is willing to be treated for depression.  Given his medical issues, we will start Zoloft 25 mg p.o. daily and titrate during the course the hospitalization.  At this point I do not believe he requires psychiatric admission.  We will get psychiatric evaluation and treatment scheduled for after discharge.  I will continue to follow the patient with you.  Also I will write for a TSH with his usual blood draw in the morning just to rule out organic issues contributing to depression.  Disposition: Patient does not meet criteria for psychiatric inpatient admission.  Antonieta PertGreg Lawson Foster Frericks, MD 11/01/2018 11:20 AM

## 2018-11-01 NOTE — Progress Notes (Signed)
Per CCMD patient had a 26 beat run of VT. Patient stated he felt a little funny and dizzy. Current BP 95/62, HR 75, SpO2 93 2L [place]. Patient stated he is no longer feeling dizzy. Alerted MD.   Madie Reno, RN

## 2018-11-01 NOTE — Progress Notes (Signed)
CRITICAL VALUE ALERT  Critical Value:  K 2.7  Date & Time Notied:  11/01/2018 0740  Provider Notified: Yes  Orders Received/Actions taken: Pending  Madie Reno, RN

## 2018-11-01 NOTE — Progress Notes (Signed)
ANTICOAGULATION CONSULT NOTE  Pharmacy Consult for heparin Indication: Ischemic right foot  Patient Measurements: Height: 5\' 10"  (177.8 cm) Weight: 179 lb 6.4 oz (81.4 kg) IBW/kg (Calculated) : 73 Heparin Dosing Weight: 79.4 kg  Vital Signs: Temp: 98.1 F (36.7 C) (04/13 2121) Temp Source: Oral (04/13 2121) BP: 79/48 (04/13 2136) Pulse Rate: 72 (04/13 2136)  Labs: Recent Labs    10/30/18 2046 10/31/18 0306 10/31/18 0541 10/31/18 1104 11/01/18 0000  HGB 10.9*  --  9.7*  --   --   HCT 33.9*  --  30.2*  --   --   PLT 282  --  238  --   --   APTT 38*  --   --   --   --   LABPROT 15.0  --   --   --   --   INR 1.2  --   --   --   --   HEPARINUNFRC  --  0.10*  --  0.11* 0.19*  CREATININE 0.65 0.68  --   --   --     Estimated Creatinine Clearance: 97.6 mL/min (by C-G formula based on SCr of 0.68 mg/dL).   Medical History: Past Medical History:  Diagnosis Date  . CHF (congestive heart failure) (HCC)   . Hypertension   . Renal disorder     Medications:  Scheduled:  . carvedilol  12.5 mg Oral BID  . furosemide  20 mg Intravenous Q12H  . ipratropium-albuterol  3 mL Nebulization Once  . ketorolac  15 mg Intravenous Q6H  . multivitamin-lutein  1 capsule Oral Daily  . Ensure Max Protein  11 oz Oral BID  . vitamin C  250 mg Oral BID    Assessment: Patient arrives d/t right foot pain is necrotic from great toe to the 3rd toe. Foot suspected to be ischemic so heparin drip being started. No anticoagulation PTA. Hgb trending down: will follow-up  Goal of Therapy:  Heparin level 0.3-0.7 units/ml Monitor platelets by anticoagulation protocol: Yes    Heparin Course 4/12 PM initiation: 4000 unit bolus, then 1150 units/hr 4/13 0306 HL 0.10: 2500 unit bolus, rate increased to 1300 units/hr 4/13 1104 HL 0.11: heparin held for approximately 4 hours, however he was administered 4000 units during the procedure 4/14 0000 HL 0.19  Plan:  4/14 HL 0.19. Level still  subtherapeutic. Will increase rate to 1800 units/hr and will recheck HL  6 hours after rate change.   --Due to recent invasive procedure I will hold off on a bolus  --BKA is scheduled for tomorrow morning. Per Dr Wyn Quaker the heparin is palliative until that can be performed.  --CBC in am per protocol   Gardner Candle, PharmD, BCPS Clinical Pharmacist 11/01/2018 12:51 AM

## 2018-11-01 NOTE — Progress Notes (Addendum)
[facility] - [place] at [facility]   PATIENT NAME: Douglas Jarvis    MR#:  [id number]  DATE OF BIRTH:  06-20-1956  SUBJECTIVE:   States he is feeling much better this morning.  His shortness of breath has improved.  He denies any foot pain.  He states that his procedure went well yesterday.  He denies any suicidal ideations this morning.  REVIEW OF SYSTEMS:  Review of Systems  Constitutional: Negative for chills and fever.  HENT: Negative for congestion and sore throat.   Eyes: Negative for blurred vision and double vision.  Respiratory: Negative for cough and shortness of breath.   Cardiovascular: Negative for chest pain and palpitations.  Gastrointestinal: Negative for nausea and vomiting.  Genitourinary: Negative for dysuria and urgency.  Musculoskeletal: Negative for back pain, joint pain and neck pain.  Neurological: Negative for dizziness and headaches.  Psychiatric/Behavioral: Positive for depression. Negative for suicidal ideas. The patient is not nervous/anxious.     DRUG ALLERGIES:   Allergies  Allergen Reactions  . Penicillins Shortness Of Breath  . Sulfa Antibiotics Other (See Comments)    Unsure of reaction as "it has been so long"   VITALS:  Blood pressure (!) 79/53, pulse 78, temperature 98.1 F (36.7 C), temperature source Oral, resp. rate (!) 23, height 5\' 10"  (1.778 m), weight 81.4 kg, SpO2 95 %. PHYSICAL EXAMINATION:  Physical Exam  GENERAL:  Laying in the bed with no acute distress.  Pleasant and conversational. HEENT: Head atraumatic, normocephalic. Pupils equal, round, reactive to light and accommodation. No scleral icterus. Extraocular muscles intact. Oropharynx and nasopharynx clear.  NECK:  Supple, no jugular venous distention. No thyroid enlargement. LUNGS: + Faint bibasilar crackles present.  No wheezes or rhonchi. No use of accessory muscles of respiration.  CARDIOVASCULAR: S1, S2 normal. No murmurs, rubs, or gallops.  ABDOMEN:  Soft, nontender, nondistended. Bowel sounds present.  EXTREMITIES: No pedal edema, cyanosis, or clubbing. + Dry gangrene of dorsum of the right foot and the first 3 toes. NEUROLOGIC: CN 2-12 intact, no focal deficits. + Global weakness. sensation intact throughout. Gait not checked.  PSYCHIATRIC: The patient is alert and oriented x 3.  SKIN: No obvious rash, lesion, or ulcer.  LABORATORY PANEL:  Male CBC Recent Labs  Lab 11/01/18 0711  WBC 9.2  HGB 9.9*  HCT 31.2*  PLT 233   ------------------------------------------------------------------------------------------------------------------ Chemistries  Recent Labs  Lab 10/30/18 2046  11/01/18 0711  NA 134*   < > 135  K 2.8*   < > 2.7*  CL 100   < > 104  CO2 22   < > 21*  GLUCOSE 113*   < > 137*  BUN 14   < > 24*  CREATININE 0.65   < > 1.03  CALCIUM 7.3*   < > 6.8*  MG  --    < > 1.7  AST 81*  --   --   ALT 41  --   --   ALKPHOS 97  --   --   BILITOT 1.2  --   --    < > = values in this interval not displayed.   RADIOLOGY:  No results found. ASSESSMENT AND PLAN:   Right foot gangrene with history of peripheral arterial disease and heavy tobacco abuse -Vascular surgery following  -s/p RLE angiogram with angioplasty and stent placement 4/13 -Plan for right BKA 4/14 -Continue IV heparin drip -Continue IV broad-spectrum antibiotics  Acute hypoxic respiratory failure secondary to acute  on chronic congestive heart failure systolic-proving, currently on 2 L O2 -EF of 50% by last echo [facility] -Cardiology following -Holding Lasix, spironolactone, Coreg in the setting of hypotension  Suicidal ideation with attempt of overdose with Coreg two weeks ago- denies suicidal ideations this morning. -Patient currently IVC'ed -Psychiatry consult- started Zoloft  Hypokalemia-due to Lasix use -Replete and recheck -Will also replete mag  DVT prophylaxis-Heparin gtt  All the records are reviewed and  case discussed with Care Management/Social Worker. Management plans discussed with the patient, family and they are in agreement.  CODE STATUS: DNR  TOTAL TIME TAKING CARE OF THIS PATIENT: 35 minutes.   More than 50% of the time was spent in counseling/coordination of care: YES  POSSIBLE D/C IN 2-3 DAYS, DEPENDING ON CLINICAL CONDITION.   Douglas Jarvis M.D on 11/01/2018 at 12:13 PM  Between 7am to 6pm - Pager - [phone]  After 6pm go to [email or web address] - Social research officer, [email or web address] [email or web address]  [facility] [place] [facility]  Office  [phone]  CC: Primary care physician; Douglas IvanLinthavong, [name], MD  Note: This dictation was prepared with Dragon dictation along with smaller phrase technology. Any transcriptional errors that result from this process are unintentional.

## 2018-11-01 NOTE — Progress Notes (Signed)
ANTICOAGULATION CONSULT NOTE  Pharmacy Consult for heparin Indication: Ischemic right foot  Patient Measurements: Height: 5\' 10"  (177.8 cm) Weight: 179 lb 6.4 oz (81.4 kg) IBW/kg (Calculated) : 73 Heparin Dosing Weight: 79.4 kg  Vital Signs: Temp: 98.8 F (37.1 C) (04/14 2023) Temp Source: Oral (04/14 2023) BP: 78/55 (04/14 2023) Pulse Rate: 74 (04/14 2023)  Labs: Recent Labs    10/30/18 2046 10/31/18 0306 10/31/18 0541  11/01/18 0711 11/01/18 1356 11/01/18 2008  HGB 10.9*  --  9.7*  --  9.9*  --   --   HCT 33.9*  --  30.2*  --  31.2*  --   --   PLT 282  --  238  --  233  --   --   APTT 38*  --   --   --   --   --   --   LABPROT 15.0  --   --   --   --   --   --   INR 1.2  --   --   --   --   --   --   HEPARINUNFRC  --  0.10*  --    < > 0.13* 0.30 0.36  CREATININE 0.65 0.68  --   --  1.03  --   --    < > = values in this interval not displayed.    Estimated Creatinine Clearance: 75.8 mL/min (by C-G formula based on SCr of 1.03 mg/dL).   Medical History: Past Medical History:  Diagnosis Date  . CHF (congestive heart failure) (HCC)   . Hypertension   . Renal disorder     Medications:  Scheduled:  . ipratropium-albuterol  3 mL Nebulization Once  . ketorolac  15 mg Intravenous Q6H  . multivitamin-lutein  1 capsule Oral Daily  . Ensure Max Protein  11 oz Oral BID  . sertraline  25 mg Oral Daily  . vitamin C  250 mg Oral BID    Assessment: Patient arrives d/t right foot pain is necrotic from great toe to the 3rd toe. Foot suspected to be ischemic so heparin drip being started. No anticoagulation PTA. Hgb was trending down now stable: will continue to follow.  Goal of Therapy:  Heparin level 0.3-0.7 units/ml Monitor platelets by anticoagulation protocol: Yes    Heparin Course 4/12 PM initiation: 4000 unit bolus, then 1150 units/hr 4/13 0306 HL 0.10: 2500 unit bolus, rate increased to 1300 units/hr 4/13 1104 HL 0.11: heparin held for approximately 4 hours,  however he was administered 4000 units during the procedure 4/14 0000 HL 0.19 Rate increased to 1800 units/hr 4/14 0711 HL 0.13 4  Plan:  4/14 @ 2008 HL 0.36. Level is therapeutic. Will continue current unfusion rate of 2000 units/hr.    *Heparin drip to continue until 5am 4/15 per vascular*  --CBC in am per protocol   Gardner Candle, PharmD, BCPS Clinical Pharmacist 11/01/2018 10:13 PM

## 2018-11-01 NOTE — Progress Notes (Signed)
MD called due to blood pressure reading of 78/55 with heart rate of 74. Patient afebrile and asymptomatic and is alert and oriented times 4. Per MD will give bolus of normal saline

## 2018-11-01 NOTE — Progress Notes (Signed)
[facility] Cardiology [facility] Encounter Note  Patient: Douglas Jarvis [facility] / Admit Date: 10/30/2018 / Date of Encounter: 11/01/2018, 8:44 AM   Subjective: Patient is feeling much better today.  No evidence of chest pain or shortness of breath.  Patient's lower extremity edema slightly improved.  Patient has had pain relief with narcotics. Lower extremity angiogram showing flush occlusion of superficial femoral artery likely causing significant gangrene of foot.  Further surgical intervention and debridement to occur later. Patient is hemodynamically stable with no evidence of cardiac symptoms at this time  Review of Systems: Positive for: Foot pain Negative for: Vision change, hearing change, syncope, dizziness, nausea, vomiting,diarrhea, bloody stool, stomach pain, cough, congestion, diaphoresis, urinary frequency, urinary pain,skin lesions, skin rashes Others previously listed  Objective: Telemetry: Normal sinus rhythm Physical Exam: Blood pressure (!) 79/53, pulse 78, temperature 98.1 F (36.7 C), temperature source Oral, resp. rate (!) 23, height 5\' 10"  (1.778 m), weight 81.4 kg, SpO2 95 %. Body mass index is 25.74 kg/m. General: Well developed, well nourished, in no acute distress. Head: Normocephalic, atraumatic, sclera non-icteric, no xanthomas, nares are without discharge. Neck: No apparent masses Lungs: Normal respirations with no wheezes, no rhonchi, no rales , few basilar crackles   Heart: Regular rate and rhythm, normal S1 S2, no murmur, no rub, no gallop, PMI is normal size and placement, carotid upstroke normal without bruit, jugular venous pressure normal Abdomen: Soft, non-tender, non-distended with normoactive bowel sounds. No hepatosplenomegaly. Abdominal aorta is normal size without bruit Extremities: Trace to 1+ edema, no clubbing, positive cyanosis, positive right foot ulcers,  Peripheral: 2+ radial, 2+ femoral, 0 + dorsal pedal pulses Neuro: Alert and oriented.  Moves all extremities spontaneously. Psych:  Responds to questions appropriately with a normal affect.   Intake/Output Summary (Last 24 hours) at 11/01/2018 0844 Last data filed at 11/01/2018 0800 Gross per 24 hour  Intake 1616.52 ml  Output 1700 ml  Net -83.48 ml    Inpatient Medications:  . ipratropium-albuterol  3 mL Nebulization Once  . ketorolac  15 mg Intravenous Q6H  . multivitamin-lutein  1 capsule Oral Daily  . potassium chloride  40 mEq Oral Q4H  . Ensure Max Protein  11 oz Oral BID  . vitamin C  250 mg Oral BID   Infusions:  . sodium chloride 75 mL/hr at 11/01/18 0800  . ceFEPime (MAXIPIME) IV Stopped (11/01/18 0510)  . heparin 1,800 Units/hr (11/01/18 0800)  . magnesium sulfate 1 - 4 g bolus IVPB 2 g (11/01/18 0819)  . metronidazole Stopped (11/01/18 0321)  . vancomycin Stopped (10/31/18 2253)    Labs: Recent Labs    10/31/18 0306 11/01/18 0711  NA 134* 135  K 2.8* 2.7*  CL 104 104  CO2 23 21*  GLUCOSE 115* 137*  BUN 13 24*  CREATININE 0.68 1.03  CALCIUM 6.8* 6.8*  MG 1.8 1.7   Recent Labs    10/30/18 2046  AST 81*  ALT 41  ALKPHOS 97  BILITOT 1.2  PROT 6.7  ALBUMIN 2.2*   Recent Labs    10/30/18 2046 10/31/18 0541 11/01/18 0711  WBC 12.9* 11.5* 9.2  NEUTROABS 10.5*  --   --   HGB 10.9* 9.7* 9.9*  HCT 33.9* 30.2* 31.2*  MCV 86.7 86.0 86.9  PLT 282 238 233   No results for input(s): CKTOTAL, CKMB, TROPONINI in the last 72 hours. Invalid input(s): POCBNP No results for input(s): HGBA1C in the last 72 hours.   Weights: [employer]  10/30/18 2049 10/30/18 2348  Weight: 79.4 kg 81.4 kg     Radiology/Studies:  Dg Chest Port 1 View  Result Date: 10/30/2018 CLINICAL DATA:  63 y/o M; cough, shortness of breath, sepsis, evaluate for pneumonia. History of congestive heart failure, hypertension, and smoking. EXAM: PORTABLE CHEST 1 VIEW COMPARISON:  None. FINDINGS: Diffuse reticular and patchy opacities of the lungs. Emphysema with upper  lobe predominance. No pneumothorax. Blunting of left costal diaphragmatic angle. Stable cardiac silhouette given projection and technique. Aortic calcific atherosclerosis. Bones are unremarkable. IMPRESSION: Diffuse reticular and patchy opacities of the lungs may reflect atypical pneumonia or pneumonitis. Small left pleural effusion. Electronically Signed   By: Mitzi HansenLance  Furusawa-Stratton M.D.   On: 10/30/2018 21:16   Dg Foot Complete Right  Result Date: 10/30/2018 CLINICAL DATA:  Right foot necrosis. EXAM: RIGHT FOOT COMPLETE - 3+ VIEW COMPARISON:  None. FINDINGS: There is no evidence of fracture or dislocation. No definite lytic destruction is seen to suggest osteomyelitis. Moderate degenerative changes seen involving the first metatarsophalangeal joint. There appears to be soft tissue gas around the second middle and distal phalanges concerning for cellulitis or ulceration. IMPRESSION: Soft tissue gas is seen involving the distal portion of the second toe concerning for cellulitis or open wound. No definite lytic destruction is seen to suggest osteomyelitis. Electronically Signed   By: Lupita RaiderJames  Green Jr, M.D.   On: 10/30/2018 21:16     Assessment and Recommendation  63 y.o. male with known cardiovascular disease with acute on chronic diastolic dysfunction heart failure and pulmonary hypertension causing lower extremity edema with peripheral vascular disease and right foot gangrene due to flush occlusion of superficial femoral artery now improved and stable without evidence of myocardial infarction 1.  Intermittent use of Lasix for any pulmonary edema congestive heart failure or lower extremity edema 2.  Low-dose beta-blocker for heart rate blood pressure control and further risk reduction cardiovascular disease 3.  No further cardiac diagnostics necessary at this time 4.  Proceed to further surgical intervention of right foot gangrene as necessary without restriction 5.  Physical rehabilitation thereafter  without restrictions  Signed, Arnoldo HookerBruce Ahniyah Giancola M.D. FACC

## 2018-11-02 ENCOUNTER — Encounter
Admission: EM | Disposition: A | Discharge status: Skilled Nursing Facility | Payer: Self-pay | Source: Home / Self Care | Attending: Internal Medicine

## 2018-11-02 ENCOUNTER — Inpatient Hospital Stay: Payer: 59

## 2018-11-02 ENCOUNTER — Inpatient Hospital Stay: Payer: 59 | Admitting: Anesthesiology

## 2018-11-02 ENCOUNTER — Encounter: Payer: Self-pay | Admitting: Anesthesiology

## 2018-11-02 DIAGNOSIS — I96 Gangrene, not elsewhere classified: Secondary | ICD-10-CM

## 2018-11-02 HISTORY — PX: AMPUTATION: SHX166

## 2018-11-02 LAB — CBC
HCT: 34.3 % — ABNORMAL LOW (ref 39.0–52.0)
Hemoglobin: 10.7 g/dL — ABNORMAL LOW (ref 13.0–17.0)
MCH: 28 pg (ref 26.0–34.0)
MCHC: 31.2 g/dL (ref 30.0–36.0)
MCV: 89.8 fL (ref 80.0–100.0)
Platelets: 251 10*3/uL (ref 150–400)
RBC: 3.82 MIL/uL — ABNORMAL LOW (ref 4.22–5.81)
RDW: 15 % (ref 11.5–15.5)
WBC: 6.8 10*3/uL (ref 4.0–10.5)
nRBC: 0 % (ref 0.0–0.2)

## 2018-11-02 LAB — MAGNESIUM: Magnesium: 2.3 mg/dL (ref 1.7–2.4)

## 2018-11-02 LAB — PROTIME-INR
INR: 1.3 — ABNORMAL HIGH (ref 0.8–1.2)
Prothrombin Time: 15.8 seconds — ABNORMAL HIGH (ref 11.4–15.2)

## 2018-11-02 LAB — BASIC METABOLIC PANEL
Anion gap: 7 (ref 5–15)
BUN: 28 mg/dL — ABNORMAL HIGH (ref 8–23)
CO2: 20 mmol/L — ABNORMAL LOW (ref 22–32)
Calcium: 7.1 mg/dL — ABNORMAL LOW (ref 8.9–10.3)
Chloride: 110 mmol/L (ref 98–111)
Creatinine, Ser: 0.85 mg/dL (ref 0.61–1.24)
GFR calc Af Amer: >60 mL/min (ref >60)
GFR calc non Af Amer: >60 mL/min (ref >60)
Glucose, Bld: 91 mg/dL (ref 70–99)
Potassium: 3.9 mmol/L (ref 3.5–5.1)
Sodium: 137 mmol/L (ref 135–145)

## 2018-11-02 LAB — GLUCOSE, CAPILLARY: Glucose-Capillary: 133 mg/dL — ABNORMAL HIGH (ref 70–99)

## 2018-11-02 LAB — TYPE AND SCREEN
ABO/RH(D): O POS
Antibody Screen: NEGATIVE

## 2018-11-02 SURGERY — AMPUTATION BELOW KNEE
Anesthesia: General | Laterality: Right

## 2018-11-02 MED ORDER — ONDANSETRON HCL 4 MG/2ML IJ SOLN
INTRAMUSCULAR | Status: DC | PRN
  Administered 2018-11-02: 4 mg via INTRAVENOUS

## 2018-11-02 MED ORDER — SUGAMMADEX SODIUM 200 MG/2ML IV SOLN
INTRAVENOUS | Status: DC | PRN
  Administered 2018-11-02: 200 mg via INTRAVENOUS

## 2018-11-02 MED ORDER — MIDAZOLAM HCL 2 MG/2ML IJ SOLN
INTRAMUSCULAR | Status: DC | PRN
  Administered 2018-11-02 (×2): 1 mg via INTRAVENOUS

## 2018-11-02 MED ORDER — PROPOFOL 10 MG/ML IV BOLUS
INTRAVENOUS | Status: AC
  Filled 2018-11-02: qty 20

## 2018-11-02 MED ORDER — LIDOCAINE HCL (PF) 2 % IJ SOLN
INTRAMUSCULAR | Status: AC
  Filled 2018-11-02: qty 10

## 2018-11-02 MED ORDER — PHENYLEPHRINE HCL (PRESSORS) 10 MG/ML IV SOLN
INTRAVENOUS | Status: AC
  Filled 2018-11-02: qty 1

## 2018-11-02 MED ORDER — ASPIRIN EC 81 MG PO TBEC
81.0000 mg | DELAYED_RELEASE_TABLET | Freq: Every day | ORAL | Status: DC
  Administered 2018-11-02 – 2018-11-09 (×8): 81 mg via ORAL
  Filled 2018-11-02 (×8): qty 1

## 2018-11-02 MED ORDER — MIDAZOLAM HCL 2 MG/2ML IJ SOLN
INTRAMUSCULAR | Status: AC
  Filled 2018-11-02: qty 2

## 2018-11-02 MED ORDER — IPRATROPIUM-ALBUTEROL 0.5-2.5 (3) MG/3ML IN SOLN
3.0000 mL | Freq: Once | RESPIRATORY_TRACT | Status: AC
  Administered 2018-11-02: 10:00:00 3 mL via RESPIRATORY_TRACT

## 2018-11-02 MED ORDER — ROCURONIUM BROMIDE 50 MG/5ML IV SOLN
INTRAVENOUS | Status: AC
  Filled 2018-11-02: qty 1

## 2018-11-02 MED ORDER — SUGAMMADEX SODIUM 200 MG/2ML IV SOLN
INTRAVENOUS | Status: AC
  Filled 2018-11-02: qty 2

## 2018-11-02 MED ORDER — SUCCINYLCHOLINE CHLORIDE 20 MG/ML IJ SOLN
INTRAMUSCULAR | Status: AC
  Filled 2018-11-02: qty 1

## 2018-11-02 MED ORDER — FENTANYL CITRATE (PF) 100 MCG/2ML IJ SOLN: 25.0000 ug | INTRAMUSCULAR | Status: DC | PRN

## 2018-11-02 MED ORDER — GLYCOPYRROLATE 0.2 MG/ML IJ SOLN
INTRAMUSCULAR | Status: AC
  Filled 2018-11-02: qty 1

## 2018-11-02 MED ORDER — METHYLPREDNISOLONE SODIUM SUCC 125 MG IJ SOLR
80.0000 mg | Freq: Once | INTRAMUSCULAR | Status: AC
  Administered 2018-11-02: 80 mg via INTRAVENOUS
  Filled 2018-11-02: qty 2

## 2018-11-02 MED ORDER — ONDANSETRON HCL 4 MG/2ML IJ SOLN
INTRAMUSCULAR | Status: AC
  Filled 2018-11-02: qty 2

## 2018-11-02 MED ORDER — EPHEDRINE SULFATE 50 MG/ML IJ SOLN
INTRAMUSCULAR | Status: AC
  Filled 2018-11-02: qty 1

## 2018-11-02 MED ORDER — PHENYLEPHRINE HCL (PRESSORS) 10 MG/ML IV SOLN
INTRAVENOUS | Status: DC | PRN
  Administered 2018-11-02 (×2): 100 ug via INTRAVENOUS
  Administered 2018-11-02: 200 ug via INTRAVENOUS
  Administered 2018-11-02: 100 ug via INTRAVENOUS
  Administered 2018-11-02: 200 ug via INTRAVENOUS
  Administered 2018-11-02: 100 ug via INTRAVENOUS

## 2018-11-02 MED ORDER — DEXAMETHASONE SODIUM PHOSPHATE 10 MG/ML IJ SOLN
INTRAMUSCULAR | Status: AC
  Filled 2018-11-02: qty 1

## 2018-11-02 MED ORDER — ACETAMINOPHEN 10 MG/ML IV SOLN
INTRAVENOUS | Status: AC
  Filled 2018-11-02: qty 100

## 2018-11-02 MED ORDER — ORAL CARE MOUTH RINSE
15.0000 mL | Freq: Two times a day (BID) | OROMUCOSAL | Status: DC
  Administered 2018-11-03 – 2018-11-09 (×7): 15 mL via OROMUCOSAL

## 2018-11-02 MED ORDER — SUCCINYLCHOLINE CHLORIDE 20 MG/ML IJ SOLN
INTRAMUSCULAR | Status: DC | PRN
  Administered 2018-11-02: 120 mg via INTRAVENOUS

## 2018-11-02 MED ORDER — FENTANYL CITRATE (PF) 100 MCG/2ML IJ SOLN
INTRAMUSCULAR | Status: DC | PRN
  Administered 2018-11-02 (×2): 50 ug via INTRAVENOUS

## 2018-11-02 MED ORDER — ROCURONIUM BROMIDE 100 MG/10ML IV SOLN
INTRAVENOUS | Status: DC | PRN
  Administered 2018-11-02: 30 mg via INTRAVENOUS

## 2018-11-02 MED ORDER — CHLORHEXIDINE GLUCONATE 0.12 % MT SOLN
15.0000 mL | Freq: Two times a day (BID) | OROMUCOSAL | Status: DC
  Administered 2018-11-02 – 2018-11-09 (×12): 15 mL via OROMUCOSAL
  Filled 2018-11-02 (×13): qty 15

## 2018-11-02 MED ORDER — LIDOCAINE HCL (CARDIAC) PF 100 MG/5ML IV SOSY
PREFILLED_SYRINGE | INTRAVENOUS | Status: DC | PRN
  Administered 2018-11-02: 100 mg via INTRAVENOUS

## 2018-11-02 MED ORDER — ACETAMINOPHEN 10 MG/ML IV SOLN
INTRAVENOUS | Status: DC | PRN
  Administered 2018-11-02: 1000 mg via INTRAVENOUS

## 2018-11-02 MED ORDER — OXYCODONE HCL 5 MG PO TABS: 5.0000 mg | ORAL_TABLET | Freq: Once | ORAL | Status: DC | PRN

## 2018-11-02 MED ORDER — OXYCODONE HCL 5 MG/5ML PO SOLN: 5.0000 mg | Freq: Once | ORAL | Status: DC | PRN

## 2018-11-02 MED ORDER — RACEPINEPHRINE HCL 2.25 % IN NEBU
INHALATION_SOLUTION | RESPIRATORY_TRACT | Status: AC
  Administered 2018-11-02: 0.5 mL via RESPIRATORY_TRACT
  Filled 2018-11-02: qty 0.5

## 2018-11-02 MED ORDER — IPRATROPIUM-ALBUTEROL 0.5-2.5 (3) MG/3ML IN SOLN
RESPIRATORY_TRACT | Status: AC
  Administered 2018-11-02: 3 mL via RESPIRATORY_TRACT
  Filled 2018-11-02: qty 3

## 2018-11-02 MED ORDER — CLOPIDOGREL BISULFATE 75 MG PO TABS
75.0000 mg | ORAL_TABLET | Freq: Every day | ORAL | Status: DC
  Administered 2018-11-02 – 2018-11-09 (×8): 75 mg via ORAL
  Filled 2018-11-02 (×8): qty 1

## 2018-11-02 MED ORDER — FUROSEMIDE 10 MG/ML IJ SOLN
INTRAMUSCULAR | Status: AC
  Administered 2018-11-02: 11:00:00 40 mg via INTRAVENOUS
  Filled 2018-11-02: qty 4

## 2018-11-02 MED ORDER — FENTANYL CITRATE (PF) 100 MCG/2ML IJ SOLN
INTRAMUSCULAR | Status: AC
  Filled 2018-11-02: qty 2

## 2018-11-02 MED ORDER — ETOMIDATE 2 MG/ML IV SOLN
INTRAVENOUS | Status: AC
  Filled 2018-11-02: qty 10

## 2018-11-02 MED ORDER — FUROSEMIDE 10 MG/ML IJ SOLN
40.0000 mg | Freq: Once | INTRAMUSCULAR | Status: AC
  Administered 2018-11-02: 11:00:00 40 mg via INTRAVENOUS

## 2018-11-02 MED ORDER — RACEPINEPHRINE HCL 2.25 % IN NEBU
0.5000 mL | INHALATION_SOLUTION | Freq: Once | RESPIRATORY_TRACT | Status: AC
  Administered 2018-11-02: 10:00:00 0.5 mL via RESPIRATORY_TRACT

## 2018-11-02 MED ORDER — ETOMIDATE 2 MG/ML IV SOLN
INTRAVENOUS | Status: DC | PRN
  Administered 2018-11-02: 20 mg via INTRAVENOUS

## 2018-11-02 MED ORDER — FUROSEMIDE 10 MG/ML IJ SOLN
40.0000 mg | Freq: Once | INTRAMUSCULAR | Status: AC
  Administered 2018-11-02: 40 mg via INTRAVENOUS

## 2018-11-02 MED ORDER — DEXAMETHASONE SODIUM PHOSPHATE 10 MG/ML IJ SOLN
INTRAMUSCULAR | Status: DC | PRN
  Administered 2018-11-02: 10 mg via INTRAVENOUS

## 2018-11-02 MED ORDER — SODIUM CHLORIDE 0.9 % IV SOLN
INTRAVENOUS | Status: DC | PRN
  Administered 2018-11-02: 50 ug/min via INTRAVENOUS

## 2018-11-02 SURGICAL SUPPLY — 40 items
BANDAGE ELASTIC 6 LF NS (GAUZE/BANDAGES/DRESSINGS) ×2 IMPLANT
BLADE SAGITTAL WIDE XTHICK NO (BLADE) IMPLANT
BLADE SAW SAG 25.4X90 (BLADE) ×2 IMPLANT
BLADE SURG SZ10 CARB STEEL (BLADE) ×1 IMPLANT
BNDG COHESIVE 4X5 TAN STRL (GAUZE/BANDAGES/DRESSINGS) ×2 IMPLANT
BNDG GAUZE 4.5X4.1 6PLY STRL (MISCELLANEOUS) ×4 IMPLANT
BRUSH SCRUB EZ  4% CHG (MISCELLANEOUS) ×1
BRUSH SCRUB EZ 4% CHG (MISCELLANEOUS) ×1 IMPLANT
CANISTER SUCT 1200ML W/VALVE (MISCELLANEOUS) ×2 IMPLANT
CHLORAPREP W/TINT 26ML (MISCELLANEOUS) ×2 IMPLANT
COVER WAND RF STERILE (DRAPES) ×2 IMPLANT
DRAIN PENROSE 1/4X12 LTX (DRAIN) IMPLANT
DRAPE INCISE IOBAN 66X45 STRL (DRAPES) IMPLANT
ELECT CAUTERY BLADE 6.4 (BLADE) ×2 IMPLANT
ELECT REM PT RETURN 9FT ADLT (ELECTROSURGICAL) ×2
ELECTRODE REM PT RTRN 9FT ADLT (ELECTROSURGICAL) ×1 IMPLANT
GAUZE PETRO XEROFOAM 1X8 (MISCELLANEOUS) ×4 IMPLANT
GLOVE BIO SURGEON STRL SZ7 (GLOVE) ×4 IMPLANT
GLOVE INDICATOR 7.5 STRL GRN (GLOVE) ×2 IMPLANT
GOWN STRL REUS W/ TWL LRG LVL3 (GOWN DISPOSABLE) ×2 IMPLANT
GOWN STRL REUS W/ TWL XL LVL3 (GOWN DISPOSABLE) ×1 IMPLANT
GOWN STRL REUS W/TWL LRG LVL3 (GOWN DISPOSABLE) ×2
GOWN STRL REUS W/TWL XL LVL3 (GOWN DISPOSABLE) ×1
HANDLE YANKAUER SUCT BULB TIP (MISCELLANEOUS) ×2 IMPLANT
KIT TURNOVER KIT A (KITS) ×2 IMPLANT
LABEL OR SOLS (LABEL) ×2 IMPLANT
NS IRRIG 1000ML POUR BTL (IV SOLUTION) ×2 IMPLANT
PACK EXTREMITY ARMC (MISCELLANEOUS) ×2 IMPLANT
PAD ABD DERMACEA PRESS 5X9 (GAUZE/BANDAGES/DRESSINGS) ×4 IMPLANT
PAD PREP 24X41 OB/GYN DISP (PERSONAL CARE ITEMS) ×2 IMPLANT
SPONGE LAP 18X18 RF (DISPOSABLE) ×3 IMPLANT
STAPLER SKIN PROX 35W (STAPLE) ×2 IMPLANT
STOCKINETTE M/LG 89821 (MISCELLANEOUS) ×2 IMPLANT
SUT SILK 2 0 (SUTURE) ×1
SUT SILK 2 0 SH (SUTURE) ×5 IMPLANT
SUT SILK 2-0 18XBRD TIE 12 (SUTURE) ×1 IMPLANT
SUT SILK 3 0 (SUTURE) ×1
SUT SILK 3-0 18XBRD TIE 12 (SUTURE) ×1 IMPLANT
SUT VIC AB 0 CT1 36 (SUTURE) ×8 IMPLANT
SUT VIC AB 2-0 CT1 (SUTURE) ×4 IMPLANT

## 2018-11-02 NOTE — Anesthesia Procedure Notes (Signed)
Procedure Name: Intubation Date/Time: 11/02/2018 8:46 AM Performed by: Doreen Salvage, CRNA Pre-anesthesia Checklist: Patient identified, Emergency Drugs available, Suction available and Patient being monitored Patient Re-evaluated:Patient Re-evaluated prior to induction Oxygen Delivery Method: Circle system utilized Preoxygenation: Pre-oxygenation with 100% oxygen Induction Type: IV induction, Cricoid Pressure applied and Rapid sequence Ventilation: Mask ventilation without difficulty Laryngoscope Size: Mac, 3 and McGraph Grade View: Grade II Tube type: Oral Tube size: 7.5 mm Number of attempts: 1 Airway Equipment and Method: Stylet Placement Confirmation: ETT inserted through vocal cords under direct vision,  positive ETCO2 and breath sounds checked- equal and bilateral Secured at: 22 cm Tube secured with: Tape Dental Injury: Teeth and Oropharynx as per pre-operative assessment

## 2018-11-02 NOTE — Progress Notes (Signed)
[place] met w/ pt in pre-op while he was being interviewed by the anesthesiologist and nurse. Pt had a positive affect and mildly anxious about the amputation of his R leg. Pt is not a diabetic as he reports but does hv CHF. Pt states that it was lifestyle choices that he made which led to his leg needing to be amputated. Ch provided a compassionate presence and words of encouragement. NT was bedside w/ pt. F/u recommended by ICU ch.    11/02/18 0815  Clinical Encounter Type  Visited With Patient;Health care provider  Visit Type Psychological support;Spiritual support;Social support;Pre-op  Spiritual Encounters  Spiritual Needs Emotional;Grief support  Stress Factors  Patient Stress Factors Health changes;Major life changes  Family Stress Factors None identified

## 2018-11-02 NOTE — Progress Notes (Signed)
[employer] - [place] at [facility]   PATIENT NAME: Douglas Jarvis    MR#:  [id number]  DATE OF BIRTH:  1955-12-17  SUBJECTIVE:  Patient seen in PACU status post amputation.  Patient was having difficulty breathing.  Case discussed with anesthesiologist at patient's bedside.  Patient given 40 IV of Lasix and Solu-Medrol.  Case also discussed with Dr. Sung Jarvis and patient will need to be transferred to ICU for closer observation due to acute respiratory distress.  REVIEW OF SYSTEMS:    Unable to obtain as patient is dyspneic on nonrebreather and agitated   Tolerating Diet:npo      DRUG ALLERGIES:   Allergies  Allergen Reactions  . Penicillins Shortness Of Breath  . Sulfa Antibiotics Other (See Comments)    Unsure of reaction as "it has been so long"    VITALS:  Blood pressure 103/84, pulse 85, temperature 97.9 F (36.6 C), resp. rate (!) 64, height 5\' 10"  (1.778 m), weight 81.4 kg, SpO2 92 %.  PHYSICAL EXAMINATION:  Constitutional: Appears moderate distress with dyspnea and agitation. HENT: Normocephalic. Marland Kitchen Nonrebreather  eyes: Conjunctivae and EOM are normal. PERRLA, no scleral icterus.  Neck: Normal ROM. Neck supple. No JVD. No tracheal deviation. CVS: RRR, S1/S2 +, no murmurs, no gallops, no carotid bruit.  Pulmonary: Effort and breath sounds normal, no stridor, rhonchi, wheezes, rales.  Abdominal: Soft. BS +,  no distension, tenderness, rebound or guarding.  Musculoskeletal: Right BKA Neuro: Alert.  Skin: Right BKA bandaged/covered   psychiatric: Agitated     LABORATORY PANEL:   CBC Recent Labs  Lab 11/02/18 0438  WBC 6.8  HGB 10.7*  HCT 34.3*  PLT 251   ------------------------------------------------------------------------------------------------------------------  Chemistries  Recent Labs  Lab 10/30/18 2046  11/02/18 0438  NA 134*   < > 137  K 2.8*   < > 3.9  CL 100   < > 110  CO2 22   < > 20*  GLUCOSE 113*   < > 91  BUN 14    < > 28*  CREATININE 0.65   < > 0.85  CALCIUM 7.3*   < > 7.1*  MG  --    < > 2.3  AST 81*  --   --   ALT 41  --   --   ALKPHOS 97  --   --   BILITOT 1.2  --   --    < > = values in this interval not displayed.   ------------------------------------------------------------------------------------------------------------------  Cardiac Enzymes No results for input(s): TROPONINI in the last 168 hours. ------------------------------------------------------------------------------------------------------------------  RADIOLOGY:  X-ray Chest Pa Or Ap  Result Date: 11/02/2018 CLINICAL DATA:  Low oxygen saturation. History of congestive heart failure, hypertension and renal disorder. Recent foot gangrene. EXAM: CHEST  1 VIEW COMPARISON:  Chest radiographs 01/31/2014 and 10/30/2018. FINDINGS: 1015 hours. The heart size and mediastinal contours are stable from the recent study. Right paratracheal and right hilar adenopathy difficult to exclude. There are slightly lower lung volumes which may account for slightly increased opacity in the perihilar regions compared with the recent prior study. Compared with the study from 2015, there are extensive interstitial and ground-glass opacities throughout both lungs. There is no pleural effusion or pneumothorax. The bones appear unchanged. IMPRESSION: 1. Slight worsening in bilateral pulmonary opacities compared with prior study from 3 days ago, possibly due to lower lung volumes. Findings could be secondary to atypical infection superimposed on underlying chronic interstitial lung disease. Other considerations include edema and progressive  underlying interstitial lung disease. 2. Continued chest radiographic follow up recommended. If the opacities persist, follow-up high-resolution chest CT may be helpful to assess for progressive interstitial lung disease. Electronically Signed   By: Carey BullocksWilliam  Veazey M.D.   On: 11/02/2018 10:38     ASSESSMENT AND PLAN:    63 year old male with tobacco dependence, chronic diastolic heart failure and peripheral arterial disease presented to the hospital due to right foot gangrene.  1.  PAD with ischemic right lower extremity: Patient eval by vascular surgery.  Revascularization was attempted however this was unsuccessful.  He is not a candidate for bypass due to extent of disease.  He is postoperative day #1 right lower extremity BKA. Likely can discontinue antibiotics Douglas PlummerMorrow as he is status post BKA. Continue Plavix 2.  Acute hypoxic respiratory failure in the setting of progressive interstitial lung disease/pulmonary edema with possible COPD exacerbation: Patient to be transferred to ICU Consultation with intensivist placed and discussed. Patient given steroids, Lasix and racemic epi in PACU  3.  Suicidal attempt with overdose of Coreg about 2 weeks ago: Patient is currently IVC as per psychiatry. Continue Zoloft Psychiatry consult appreciated.      Management plans discussed with the patient and Dr Douglas AmabileSimonds  CODE STATUS: DNR  CRITICAL CARETOTAL TIME TAKING CARE OF THIS PATIENT: 33 minutes.     POSSIBLE D/C 3-5 days, DEPENDING ON CLINICAL CONDITION.   Douglas SaranSital Myishia Jarvis M.D on 11/02/2018 at 12:41 PM  Between 7am to 6pm - Pager - [phone] After 6pm go to [email or web address] - password [email or web address]  [facility] [facility]  Office  [phone]  CC: Primary care physician; Douglas IvanLinthavong, [name], MD  Note: This dictation was prepared with Dragon dictation along with smaller phrase technology. Any transcriptional errors that result from this process are unintentional.

## 2018-11-02 NOTE — H&P (Signed)
[facility] History & Physical Update  The patient was interviewed and re-examined.  The patient's previous History and Physical has been reviewed and is unchanged.  There is no change in the plan of care. We plan to proceed with the scheduled procedure.  Festus Barren, MD  11/02/2018, 8:26 AM

## 2018-11-02 NOTE — Progress Notes (Signed)
Pt was transported to CCU from PACU while on the bipap.

## 2018-11-02 NOTE — Anesthesia Postprocedure Evaluation (Signed)
Anesthesia Post Note  Patient: Corell Orloff Maslowski  Procedure(s) Performed: AMPUTATION BELOW KNEE (Right )  Patient location during evaluation: PACU Anesthesia Type: General Level of consciousness: awake and alert Pain management: pain level controlled Vital Signs Assessment: post-procedure vital signs reviewed and stable Respiratory status: spontaneous breathing (Patient on BiPap) Cardiovascular status: blood pressure returned to baseline and stable Postop Assessment: no apparent nausea or vomiting Anesthetic complications: no Comments: Concern for volume overload.  Patient only received 500 mL from our team but lasix stopped many days ago.  CXR in PACU concerning for pulmonary edema.  I spoke with his medicine MD.  Patient given lasix, placed on BiPap and transferred to ICU.     Last Vitals:  Vitals:   11/02/18 1040 11/02/18 1107  BP: 107/74 103/84  Pulse: 97 85  Resp: 11 (!) 64  Temp:  36.6 C  SpO2: 98% 92%    Last Pain:  Vitals:   11/02/18 1107  TempSrc:   PainSc: 0-No pain                 Cleda Mccreedy Piscitello

## 2018-11-02 NOTE — Anesthesia Post-op Follow-up Note (Signed)
Anesthesia QCDR form completed.        

## 2018-11-02 NOTE — Transfer of Care (Signed)
Immediate Anesthesia Transfer of Care Note  Patient: Douglas Jarvis  Procedure(s) Performed: Procedure(s): AMPUTATION BELOW KNEE (Right)  Patient Location: PACU  Anesthesia Type:General  Level of Consciousness: sedated  Airway & Oxygen Therapy: Patient Spontanous Breathing and Patient connected to face mask oxygen  Post-op Assessment: Report given to RN and Post -op Vital signs reviewed and stable  Post vital signs: Reviewed and stable  Last Vitals:  Vitals:   11/02/18 0759 11/02/18 1011  BP: 106/72 105/71  Pulse: 90 88  Resp: 17 14  Temp: (!) 38.1 C 36.6 C  SpO2: 92% (!) 88%    Complications: No apparent anesthesia complications

## 2018-11-02 NOTE — Anesthesia Preprocedure Evaluation (Addendum)
Anesthesia Evaluation  Patient identified by MRN, date of birth, ID band Patient awake    Reviewed: Allergy & Precautions, H&P , NPO status , Patient's Chart, lab work & pertinent test results  History of Anesthesia Complications Negative for: history of anesthetic complications  Airway Mallampati: III  TM Distance: >3 FB Neck ROM: limited    Dental  (+) Chipped, Poor Dentition   Pulmonary COPD, Current Smoker,           Cardiovascular Exercise Tolerance: Good hypertension, (-) angina+CHF  (-) Past MI      Neuro/Psych negative neurological ROS  negative psych ROS   GI/Hepatic negative GI ROS, Neg liver ROS, neg GERD  ,  Endo/Other  negative endocrine ROS  Renal/GU Renal disease     Musculoskeletal   Abdominal   Peds  Hematology negative hematology ROS (+)   Anesthesia Other Findings Patient has cardiac clearance for this procedure.   Past Medical History: No date: CHF (congestive heart failure) (HCC) No date: Hypertension No date: Renal disorder  Past Surgical History: 10/31/2018: LOWER EXTREMITY ANGIOGRAPHY; Right     Comment:  Procedure: Lower Extremity Angiography;  Surgeon: Annice Needy, MD;  Location: [facility] INVASIVE CV LAB;  Service:               Cardiovascular;  Laterality: Right; 10/31/2018: PERIPHERAL VASCULAR BALLOON ANGIOPLASTY     Comment:  Procedure: PERIPHERAL VASCULAR BALLOON ANGIOPLASTY;                Surgeon: Annice Needy, MD;  Location: [facility] INVASIVE CV               LAB;  Service: Cardiovascular;; 10/31/2018: PERIPHERAL VASCULAR INTERVENTION     Comment:  Procedure: PERIPHERAL VASCULAR INTERVENTION;  Surgeon:               Annice Needy, MD;  Location: [facility] INVASIVE CV LAB;                Service: Cardiovascular;;  Right profunda femoris No date: TONSILLECTOMY  BMI    Body Mass Index:  25.74 kg/m      Reproductive/Obstetrics negative OB ROS                             Anesthesia Physical Anesthesia Plan  ASA: IV  Anesthesia Plan: General ETT   Post-op Pain Management:    Induction: Intravenous  PONV Risk Score and Plan: Ondansetron, Dexamethasone, Midazolam and Treatment may vary due to age or medical condition  Airway Management Planned: Oral ETT  Additional Equipment:   Intra-op Plan:   Post-operative Plan: Extubation in OR and Possible Post-op intubation/ventilation  Informed Consent: I have reviewed the patients History and Physical, chart, labs and discussed the procedure including the risks, benefits and alternatives for the proposed anesthesia with the patient or authorized representative who has indicated his/her understanding and acceptance.   Patient has DNR.  Discussed DNR with patient and Suspend DNR.   Dental Advisory Given  Plan Discussed with: Anesthesiologist, CRNA and Surgeon  Anesthesia Plan Comments: (Patient informed that they are higher risk for complications from anesthesia during this procedure due to their medical history.  Patient voiced understanding.\  Patient consented for risks of anesthesia including but not limited to:  - adverse reactions to medications - damage to teeth, lips or other oral mucosa - sore  throat or hoarseness - Damage to heart, brain, lungs or loss of life  Patient voiced understanding.)        Anesthesia Quick Evaluation

## 2018-11-02 NOTE — Progress Notes (Signed)
Pharmacy Antibiotic Note  Douglas Jarvis is a 63 y.o. male admitted on 10/30/2018 with pneumonia/necrotic/gangrenous right foot. 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 Per hospitalist note, may discontinue antibiotics tomorrow, 4/16.  Pharmacy has been consulted for vanc/cefepime dosing   Plan: Vancomycin 1000 mg IV Q 12 hrs. Goal AUC 400-550. Expected AUC: 507 SCr used: 1.03 4/15 Will continue this dose for today as Scr has fluctuated  Will continue cefepime 2g IV q8h   Height: 5\' 10"  (177.8 cm) Weight: 179 lb 6.4 oz (81.4 kg) IBW/kg (Calculated) : 73  Temp (24hrs), Avg:98.9 F (37.2 C), Min:97.9 F (36.6 C), Max:100.6 F (38.1 C)  Recent Labs  Lab 10/30/18 2046 10/31/18 0001 10/31/18 0306 10/31/18 0541 11/01/18 0711 11/02/18 0438  WBC 12.9*  --   --  11.5* 9.2 6.8  CREATININE 0.65  --  0.68  --  1.03 0.85  LATICACIDVEN 1.9 1.9  --   --   --   --     Estimated Creatinine Clearance: 91.8 mL/min (by C-G formula based on SCr of 0.85 mg/dL).    Allergies  Allergen Reactions  . Penicillins Shortness Of Breath  . Sulfa Antibiotics Other (See Comments)    Unsure of reaction as "it has been so long"   Antibiotics 4/12 Cefepime >> 4/12 Vancomycin  4/12 Flagyl >>  Microbiology 4/12 BCx NGTD 4/12 UCx NG  Thank you for allowing pharmacy to be a part of this patient's care.   Mauri Reading, PharmD Pharmacy Resident  11/02/2018 3:09 PM

## 2018-11-02 NOTE — Progress Notes (Signed)
RN called report to ICU nurse. Pt is going to be transfer from PACU to ICU.

## 2018-11-02 NOTE — Op Note (Signed)
OPERATIVE NOTE   PROCEDURE: Right below-the-knee amputation  PRE-OPERATIVE DIAGNOSIS: Right foot gangrene  POST-OPERATIVE DIAGNOSIS: same as above  SURGEON: Festus BarrenJason Dew, MD  ASSISTANT(S): Raul DelKim Stegmayer, PA-C  ANESTHESIA: general  ESTIMATED BLOOD LOSS: 100 cc  FINDING(S): none  SPECIMEN(S):  Right below-the-knee amputation  INDICATIONS:   Douglas Jarvis is a 63 y.o. male who presents with right leg gangrene.  The patient is scheduled for a right below-the-knee amputation.  I discussed in depth with the patient the risks, benefits, and alternatives to this procedure.  The patient is aware that the risk of this operation included but are not limited to:  bleeding, infection, myocardial infarction, stroke, death, failure to heal amputation wound, and possible need for more proximal amputation.  The patient is aware of the risks and agrees proceed forward with the procedure. An assistant was present during the procedure to help facilitate the exposure and expedite the procedure.  DESCRIPTION:  After full informed written consent was obtained from the patient, the patient was brought back to the operating room, and placed supine upon the operating table.  Prior to induction, the patient received IV antibiotics.  The patient was then prepped and draped in the standard fashion for a below-the-knee amputation. The assistant provided retraction and mobilization to help facilitate exposure and expedite the procedure throughout the entire procedure.  This included following suture, using retractors, and optimizing lighting. After obtaining adequate anesthesia, the patient was prepped and draped in the standard fashion for a right below-the-knee amputation.  I marked out the anterior incision two finger breadths below the tibial tuberosity and then the marked out a posterior flap that was one third of the circumference of the calf in length.   I made the incisions for these flaps, and then  dissected through the subcutaneous tissue, fascia, and muscle anteriorly.  I elevated  the periosteal tissue superiorly so that the tibia was about 3-4 cm shorter than the anterior skin flap.  I then transected the tibia with a power saw and then took a wedge off the tibia anteriorly with the power saw.  Then I smoothed out the rough edges.  In a similar fashion, I cut back the fibula about two centimeters higher than the level of the tibia with a bone cutter.  I put a bone hook into the distal tibia and then used a large amputation knife to sharply develop a tissue plane through the muscle along the fibula.  In such fashion, the posterior flap was developed.  At this point, the specimen was passed off the field as the below-the-knee amputation.  At this point, I clamped all visibly bleeding arteries and veins using a combination of suture ligation with Silk suture and electrocautery.  Bleeding continued to be controlled with electrocautery and suture ligature.  The stump was washed off with sterile normal saline and no further active bleeding was noted.  I reapproximated the anterior and posterior fascia  with interrupted stitches of 0 Vicryl.  This was completed along the entire length of anterior and posterior fascia until there were no more loose space in the fascial line. I then placed a layer of 2-0 Vicryl sutures in the subcutaneous tissue. The skin was then  reapproximated with staples.  The stump was washed off and dried.  The incision was dressed with Xeroform and  then fluffs were applied.  Kerlix was wrapped around the leg and then gently an ACE wrap was applied.    COMPLICATIONS: none  CONDITION:  stable   Festus Barren  11/02/2018, 9:44 AM    This note was created with Dragon Medical transcription system. Any errors in dictation are purely unintentional.

## 2018-11-02 NOTE — Progress Notes (Signed)
Patient came to ICU/SDU after R BKA.  He was briefly on BiPAP.  Presently comfortable on [place] O2.  PCCM service will help keep an eye on him while in the ICU/SDU.  Likely transfer out in a.m. 4/16.  Billy Fischer, MD PCCM service Mobile [phone] Pager [phone] 11/02/2018 2:21 PM

## 2018-11-02 NOTE — Consult Note (Signed)
  Patient just returned from surgical procedure.  Had amputation of his right leg.  He was just being transferred to the ICU at the time I went to evaluate him.  We will attempt to make contact with him this afternoon or perhaps tomorrow morning depending on how his recovery goes from his surgery.  Roanna Banning MD

## 2018-11-02 NOTE — Progress Notes (Signed)
Ch f/u w/ pt post-op. Pt seemed to have a positive affect with the exception of not wanting to be in ICU. Pt c/o wanting to be in Rm 223. Ch participated in active listening w/ pt w/ the NT Psychiatrist) present due to pt being suicide watch. Ch discussed w/ pt how he was feeling about the consequences related to the suicide attempt. Pt stated that he wanted to give up because of his lack of quality of life. Ch validated pt's feelings and also dicussed the different types of pain that he has endured for a long time. Pt does hv the support of family but grieves not being able to have a partner that his family would accept. Ch helped the pt to name what it is he was longing to have in his life and the importance of his spiritual beliefs. Pt appreciated visit. F/u recommended.    11/02/18 1400  Clinical Encounter Type  Visited With Patient;Health care provider  Visit Type Psychological support;Spiritual support;Social support;Post-op  Spiritual Encounters  Spiritual Needs Emotional;Grief support  Stress Factors  Patient Stress Factors Exhausted;Health changes;Loss;Major life changes  Family Stress Factors Family relationships;Major life changes

## 2018-11-03 ENCOUNTER — Inpatient Hospital Stay: Payer: 59

## 2018-11-03 DIAGNOSIS — Z89511 Acquired absence of right leg below knee: Secondary | ICD-10-CM

## 2018-11-03 LAB — BASIC METABOLIC PANEL
Anion gap: 8 (ref 5–15)
BUN: 38 mg/dL — ABNORMAL HIGH (ref 8–23)
CO2: 19 mmol/L — ABNORMAL LOW (ref 22–32)
Calcium: 7.5 mg/dL — ABNORMAL LOW (ref 8.9–10.3)
Chloride: 109 mmol/L (ref 98–111)
Creatinine, Ser: 1.12 mg/dL (ref 0.61–1.24)
GFR calc Af Amer: >60 mL/min (ref >60)
GFR calc non Af Amer: >60 mL/min (ref >60)
Glucose, Bld: 131 mg/dL — ABNORMAL HIGH (ref 70–99)
Potassium: 4.7 mmol/L (ref 3.5–5.1)
Sodium: 136 mmol/L (ref 135–145)

## 2018-11-03 LAB — CBC
HCT: 31.3 % — ABNORMAL LOW (ref 39.0–52.0)
Hemoglobin: 9.6 g/dL — ABNORMAL LOW (ref 13.0–17.0)
MCH: 27.5 pg (ref 26.0–34.0)
MCHC: 30.7 g/dL (ref 30.0–36.0)
MCV: 89.7 fL (ref 80.0–100.0)
Platelets: 258 10*3/uL (ref 150–400)
RBC: 3.49 MIL/uL — ABNORMAL LOW (ref 4.22–5.81)
RDW: 14.8 % (ref 11.5–15.5)
WBC: 8.6 10*3/uL (ref 4.0–10.5)
nRBC: 0 % (ref 0.0–0.2)

## 2018-11-03 MED ORDER — ENOXAPARIN SODIUM 40 MG/0.4ML ~~LOC~~ SOLN
40.0000 mg | SUBCUTANEOUS | Status: DC
  Administered 2018-11-03 – 2018-11-09 (×7): 40 mg via SUBCUTANEOUS
  Filled 2018-11-03 (×7): qty 0.4

## 2018-11-03 MED ORDER — SERTRALINE HCL 50 MG PO TABS
50.0000 mg | ORAL_TABLET | Freq: Every day | ORAL | Status: DC
  Administered 2018-11-04 – 2018-11-09 (×6): 50 mg via ORAL
  Filled 2018-11-03 (×6): qty 1

## 2018-11-03 NOTE — Evaluation (Signed)
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 PMHx includes systolic congestive heart failure and hypertension. Patient on 4L O2 via [place].   Clinical Impression   Pt seen for OT evaluation this date. Prior to surgery, pt required assist from his friend who helps with ADL and IADL daily and he endorses difficulty walking over the past 2 weeks. Prior to that pt ambulated with a SPC. Pt is eager to return to PLOF with less pain and improved safety and independence. 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 Pt instructed in RLE positioning, phantom pain mgt, desensitization strategies for management of residual limb, falls prevention strategies, and cognitive behavioral pain coping strategies. Introduced the concept of energy conservation and pt eager to learn more next session. Pt would benefit from skilled OT services including additional instruction in dressing techniques with or without assistive devices for dressing and bathing skills to support recall and carryover prior to discharge and ultimately to maximize safety, independence, and minimize falls risk and caregiver burden. Recommend STR to maximize safety/independence/decreased caregiver burden.      Follow Up Recommendations  SNF    Equipment Recommendations  Other (comment)(TBD)    Recommendations for Other Services       Precautions / Restrictions Precautions Precautions: Fall Precaution Comments: s/p R BKA Restrictions Weight Bearing Restrictions: Yes RLE Weight Bearing: Non weight bearing Other Position/Activity Restrictions: s/p R BKA      Mobility Bed Mobility Overal bed mobility: Modified Independent             General bed mobility  comments: Patient is able to perform all bed mobility independently and without cueing; however requires increased time to complete tasks 2/2 to fatigue and decreased SPO2.  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    Balance Overall balance assessment: Needs assistance Sitting-balance support: Feet supported Sitting balance-Leahy Scale: Fair Sitting balance - Comments: Patient able to maintain balance with A/P challenge but falters with weight shifts requiring UE support.                                   ADL either performed or assessed with clinical judgement   ADL Overall ADL's : Needs assistance/impaired Eating/Feeding: Modified independent   Grooming: Modified independent   Upper Body Bathing: Sitting;Minimal assistance   Lower Body Bathing: Sitting/lateral leans;Maximal assistance   Upper Body Dressing : Sitting;Minimal assistance   Lower Body Dressing: Sitting/lateral leans;Maximal assistance   Toilet Transfer: +2 for physical assistance;Maximal assistance;BSC                   Vision Baseline Vision/History: Wears glasses Wears Glasses: At all times Patient Visual Report: No change from baseline       Perception     Praxis      Pertinent Vitals/Pain Pain Assessment: No/denies pain  Hand Dominance Right   Extremity/Trunk Assessment Upper Extremity Assessment Upper Extremity Assessment: Overall WFL for tasks assessed;RUE deficits/detail;LUE deficits/detail RUE Deficits / Details: 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; per pt this limits his strength/grip/pinch for [facility]  tasks LUE Deficits / Details: 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; per pt this limits his strength/grip/pinch for [facility] tasks   Lower Extremity Assessment Lower Extremity Assessment: Generalized weakness;RLE deficits/detail RLE Deficits / Details: s/p BKA RLE: Unable to fully assess due to pain       Communication Communication Communication: No difficulties   Cognition Arousal/Alertness: Awake/alert Behavior During Therapy: WFL for tasks assessed/performed Overall Cognitive Status: Within Functional Limits for tasks assessed                                     General Comments       Exercises Other Exercises: pt instructed in desensitization strategies for stump, RLE positioning to maximize knee extension, and cognitive behavioral pain coping strategies    Shoulder Instructions      Home Living Family/patient expects to be discharged to:: Skilled nursing facility Living Arrangements: Alone Available Help at Discharge: Skilled Nursing Facility(Patient denies having the necessary assistance at his prior residence available at this time.)                                    Prior Functioning/Environment Level of Independence: Needs assistance  Gait / Transfers Assistance Needed: Patient states he has not been able to walk but a few steps for the past two weeks. Prior to that was ambulating with a SPC for the past 10 yrs. ADL's / Homemaking Assistance Needed: Pt reports he has a friend come assist w/ ADL and IADL daily for a couple hours (for past several years). Pt reports she will not be available to assist anymore after return home. Pt denies medication mgt difficulty or additional falls aside from 1 prior to hospitalization.            OT Problem List: Decreased strength;Decreased knowledge of use of DME or AE;Decreased coordination;Impaired UE functional use;Impaired balance (sitting and/or standing)      OT Treatment/Interventions:  Self-care/ADL training;Balance training;Therapeutic exercise;Therapeutic activities;DME and/or AE instruction;Patient/family education;Energy conservation    OT Goals(Current goals can be found in the care plan section) Acute Rehab OT Goals Patient Stated Goal: to be more independent Time For Goal Achievement: 11/17/18 Potential to Achieve Goals: Good ADL Goals Pt Will Perform Lower Body Dressing: with adaptive equipment;with mod assist;sitting/lateral leans;with min assist Pt Will Transfer to Toilet: with max assist;stand pivot transfer;bedside commode Additional ADL Goal #1: Pt will independently perform learned desensitization strategies to support future prosthetic trials. Additional ADL Goal #2: Pt will independently position RLE to encourage knee extension when at rest in preparation for prosthetic trails.  OT Frequency: Min 2X/week   Barriers to D/C: Decreased caregiver support;Inaccessible home environment          Co-evaluation              AM-PAC OT "6 Clicks" Daily Activity     Outcome Measure Help from another person eating meals?: None Help from another person taking care of personal grooming?: None Help from another person toileting, which includes using toliet, bedpan, or urinal?: Total Help from another person bathing (including washing, rinsing, drying)?: A Lot Help from another  person to put on and taking off regular upper body clothing?: A Little Help from another person to put on and taking off regular lower body clothing?: A Lot 6 Click Score: 16   End of Session    Activity Tolerance: Patient tolerated treatment well Patient left: in bed;with call bell/phone within reach;with bed alarm set  OT Visit Diagnosis: Other abnormalities of gait and mobility (R26.89);History of falling (Z91.81);Muscle weakness (generalized) (M62.81);Pain Pain - Right/Left: Right Pain - part of body: Knee;Leg                Time: [id number] OT Time Calculation (min): 34  min Charges:  OT General Charges $OT Visit: 1 Visit OT Evaluation $OT Eval Moderate Complexity: 1 Mod OT Treatments $Therapeutic Activity: 23-37 mins  Richrd PrimeJamie Stiller, MPH, MS, OTR/L ascom [phone] 11/03/18, 3:31 PM

## 2018-11-03 NOTE — Consult Note (Signed)
Psychiatric consultation follow-up note  Reason for Consult: Intentional overdose Referring Physician:  Allena KatzPatel Patient Identification: Douglas Jarvis MRN:  [id number] Principal Diagnosis: <principal problem not specified> Diagnosis:  Active Problems:   Gangrene of right foot (HCC)   Total Time spent with patient:  18 minutes  Subjective:   Douglas Jarvis is a 63 y.o. male patient admitted with intentional Coreg overdose and cellulitis.  Objective: Patient is seen and examined.  Patient is a [age] year old male with a past psychiatric history significant for major depression, generalized anxiety disorder and a past medical history significant for congestive heart failure, hypertension, right foot necrosis, peripheral artery disease, and recent BKA of the right lower extremity on 11/02/2018.  He is seen in follow-up.  Yesterday I saw him shortly after he had been taken to the ICU after surgery.  He was having some issues at that time and so examination was not done.  Today he is seen, he is doing well today.  He is awake, alert and engaging.  He denied any suicidal ideation.  He denied any helplessness, hopelessness or worthlessness.  He is unsure about any side effects to the Zoloft so far.  He is on a significant amount of medication right now as well as still having some anesthetic agents in his system.  He is alert and oriented x3.  His laboratories this a.m. are essentially unchanged from admission.  His pain control at this point is adequate.  His vital signs are stable, he is afebrile.  Current medications: Cefepime 2 g IV every 8 hours Peridex oral solution 0.12% 15 cc twice daily Plavix 75 mg p.o. daily Lovenox 40 mg subcu to 24 hours DuoNeb nebulizations as needed Toradol 15 mg IV every 6 hours Methylprednisolone 80 mg x 1 Metronidazole 500 mg IV every 8 hours Zoloft 25 mg p.o. daily Tramadol 50 mg every 6 hours as needed pain Vancomycin at thousand milligrams IV every 12  hours Vitamin C  Psychiatric evaluation: General Appearance: Disheveled  Eye Contact:  Good  Speech:  Normal Rate  Volume:  Normal  Mood:  Anxious  Affect:  Congruent  Thought Process:  Coherent and Descriptions of Associations: Intact  Orientation:  Full (Time, Place, and Person)  Thought Content:  Logical  Suicidal Thoughts:  No  Homicidal Thoughts:  No  Memory:  Immediate;   Fair Recent;   Fair Remote;   Fair  Judgement:  Intact  Insight:  Fair  Psychomotor Activity:  Decreased  Concentration:  Concentration: Fair and Attention Span: Fair  Recall:  [employer]Fair  Fund of Knowledge:  Fair  Language:  Fair  Akathisia:  Negative  Handed:  Right  AIMS (if indicated):     Assets:  Desire for Improvement Resilience  ADL's:  Intact  Cognition:  WNL  Sleep:      Treatment Plan Summary: Daily contact with patient to assess and evaluate symptoms and progress in treatment, Medication management and Plan : Patient is seen and examined.  Patient is a [age] year old male with the above-stated past psychiatric history.  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  He currently regrets his overdose.  He wants to live and do well.  He has no history of substance issues outside of tobacco.    He has tolerated the 25 mg of Zoloft, and I will go on and increase that up to 50 mg p.o. daily.  His TSH is slightly elevated at 4.461, but no change in  treatment at this point.  This will need to be followed up.  Additionally he will need a referral to an outpatient psychiatrist after discharge.  No other changes in his medications.  We will continue to follow with you.

## 2018-11-03 NOTE — BH Assessment (Signed)
[facility]  PAPERS  RESCINDED  PER  DR Jola Babinski MD  INFORMED  RN  BETH ON 2C FLOOR

## 2018-11-03 NOTE — Progress Notes (Signed)
[employer] - [place] [facility] [place] [facility]   PATIENT NAME: Douglas Jarvis    MR#:  [id number]  DATE OF BIRTH:  10-15-1955  SUBJECTIVE:   Patient transferred out of ICU yesterday and is actually doing well this morning.  Patient denies suicidal ideation.  Patient had episode of asymptomatic V. tach.  Potassium level normal.  Yesterday's magnesium level 2.3.  Patient was not on oxygen during this episode.  He is currently back on oxygen 95% on 4 L  REVIEW OF SYSTEMS:    Review of Systems  Constitutional: Negative.  Negative for chills, fever and malaise/fatigue.  HENT: Negative.  Negative for ear discharge, ear pain, hearing loss, nosebleeds and sore throat.   Eyes: Negative.  Negative for blurred vision and pain.  Respiratory: Negative.  Negative for cough, hemoptysis, shortness of breath and wheezing.   Cardiovascular: Negative.  Negative for chest pain, palpitations and leg swelling.  Gastrointestinal: Negative.  Negative for abdominal pain, blood in stool, diarrhea, nausea and vomiting.  Genitourinary: Negative.  Negative for dysuria.  Musculoskeletal: Negative.  Negative for back pain.       Pain from bka   Skin: Negative.   Neurological: Negative for dizziness, tremors, speech change, focal weakness, seizures and headaches.  Endo/Heme/Allergies: Negative.  Does not bruise/bleed easily.  Psychiatric/Behavioral: Negative.  Negative for depression, hallucinations and suicidal ideas.      Tolerating Diet: yes     DRUG ALLERGIES:   Allergies  Allergen Reactions  . Penicillins Shortness Of Breath  . Sulfa Antibiotics Other (See Comments)    Unsure of reaction as "it has been so long"    VITALS:  Blood pressure 99/67, pulse 74, temperature 97.9 F (36.6 C), temperature source Oral, resp. rate 18, height 5\' 10"  (1.778 m), weight 81.4 kg, SpO2 94 %.  PHYSICAL EXAMINATION:  Constitutional: Appears moderate distress with dyspnea and agitation. HENT: Normocephalic.  Marland Kitchen Nonrebreather  eyes: Conjunctivae and EOM are normal. PERRLA, no scleral icterus.  Neck: Normal ROM. Neck supple. No JVD. No tracheal deviation. CVS: RRR, S1/S2 +, no murmurs, no gallops, no carotid bruit.  Pulmonary: Effort and breath sounds normal, no stridor, rhonchi, wheezes, rales.  Abdominal: Soft. BS +,  no distension, tenderness, rebound or guarding.  Musculoskeletal: Right BKA Neuro: Alert.  Skin: Right BKA bandaged/covered   psychiatric: Agitated     LABORATORY PANEL:   CBC Recent Labs  Lab 11/03/18 0452  WBC 8.6  HGB 9.6*  HCT 31.3*  PLT 258   ------------------------------------------------------------------------------------------------------------------  Chemistries  Recent Labs  Lab 10/30/18 2046  11/02/18 0438 11/03/18 0452  NA 134*   < > 137 136  K 2.8*   < > 3.9 4.7  CL 100   < > 110 109  CO2 22   < > 20* 19*  GLUCOSE 113*   < > 91 131*  BUN 14   < > 28* 38*  CREATININE 0.65   < > 0.85 1.12  CALCIUM 7.3*   < > 7.1* 7.5*  MG  --    < > 2.3  --   AST 81*  --   --   --   ALT 41  --   --   --   ALKPHOS 97  --   --   --   BILITOT 1.2  --   --   --    < > = values in this interval not displayed.   ------------------------------------------------------------------------------------------------------------------  Cardiac Enzymes No results for input(s): TROPONINI in  the last 168 hours. ------------------------------------------------------------------------------------------------------------------  RADIOLOGY:  Dg Chest 1 View  Result Date: 11/03/2018 CLINICAL DATA:  Pneumonia EXAM: CHEST  1 VIEW COMPARISON:  11/02/2018 FINDINGS: Diffuse bilateral airspace opacities are stable on the left and improved in the right lung. Normal heart size. Low lung volumes. No pneumothorax or pleural effusion. IMPRESSION: Bilateral airspace opacities are stable on the left and improved on the right. Electronically Signed   By: Jolaine ClickArthur  Hoss M.D.   On: 11/03/2018  09:00   X-ray Chest Pa Or Ap  Result Date: 11/02/2018 CLINICAL DATA:  Low oxygen saturation. History of congestive heart failure, hypertension and renal disorder. Recent foot gangrene. EXAM: CHEST  1 VIEW COMPARISON:  Chest radiographs 01/31/2014 and 10/30/2018. FINDINGS: 1015 hours. The heart size and mediastinal contours are stable from the recent study. Right paratracheal and right hilar adenopathy difficult to exclude. There are slightly lower lung volumes which may account for slightly increased opacity in the perihilar regions compared with the recent prior study. Compared with the study from 2015, there are extensive interstitial and ground-glass opacities throughout both lungs. There is no pleural effusion or pneumothorax. The bones appear unchanged. IMPRESSION: 1. Slight worsening in bilateral pulmonary opacities compared with prior study from 3 days ago, possibly due to lower lung volumes. Findings could be secondary to atypical infection superimposed on underlying chronic interstitial lung disease. Other considerations include edema and progressive underlying interstitial lung disease. 2. Continued chest radiographic follow up recommended. If the opacities persist, follow-up high-resolution chest CT may be helpful to assess for progressive interstitial lung disease. Electronically Signed   By: Carey BullocksWilliam  Veazey M.D.   On: 11/02/2018 10:38     ASSESSMENT AND PLAN:   63 year old male with tobacco dependence, chronic diastolic heart failure and peripheral arterial disease presented to the hospital due to right foot gangrene.  1.  PAD with ischemic right lower extremity:   Revascularization was attempted however this was unsuccessful.  He is not a candidate for bypass due to extent of disease.  He is postoperative day #1 right lower extremity BKA. Likely can discontinue antibiotics  as he is status post BKA. Continue Plavix Plan for SNF at discharge.    Resting changed today by vascular  surgery. 2.  Acute hypoxic respiratory failure in the setting of progressive interstitial lung disease/pulmonary edema : Patient has improved  Chest x-ray this morning shows improvement in pulmonary edema. We will give 1 time dose of IV Lasix and continue to monitor.  3.  Suicidal attempt with overdose of Coreg about 2 weeks ago:  Discussed with psychiatry today.  Patient does not need IVC or sitter.  This will be discontinued  continue Zoloft Psychiatry consult appreciated.   4.  Asymptomatic V. tach: Potassium is normal and magnesium yesterday was 2.3 Continue telemetry   Management plans discussed with the patient  CODE STATUS: DNR  CRITICAL CARETOTAL TIME TAKING CARE OF THIS PATIENT: 24 minutes.     POSSIBLE D/C tomorrow, DEPENDING ON CLINICAL CONDITION.   Adrian SaranSital Shaheed Schmuck M.D on 11/03/2018 at 10:53 AM  Between 7am to 6pm - Pager - [phone] After 6pm go to [email or web address] - password [email or web address]  [facility]  Office  [phone]  CC: Primary care physician; [name], [name], MD  Note: This dictation was prepared with Dragon dictation along with smaller phrase technology. Any transcriptional errors that result from this process are unintentional.

## 2018-11-03 NOTE — Evaluation (Signed)
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 PMHx includes systolic congestive heart failure and hypertension. Patient on 4L O2 via [place].  Clinical Impression  Patient awake and alert w/ 4L O2 via [place] on arrival to the room and was amenable to physical therapy evaluation. Patient requires increased time to complete all tasks due to increased fatigue and weakness, but is able to perform bed mobility independently without cueing. Patient able to sit EOB for > 20 minutes today with SpO2 ranging from 81% (after engaging in movement tasks or conversation) to 91% with quiet rest. Patient is able to handle moderate anterior/posterior challenge to sitting balance, but does require intermittent SUE/BUE support as fatigue level increases. Patient attempted sit to stand with maximal cueing for DME safety, hand positioning, and sequencing; patient required Max A +2 and was not able to achieve full standing due to the deformity of the left foot and strength deficits of BUE and LLE required for SLS. Patient will benefit from skilled therapeutic intervention to address strength deficits, balance impairments, mobility, DME use, and self-care/home management in order to improve level of function and overall QOL.  Patient left in sitting up in bed with 4L O2 via [place], call bell in reach, with all need requests met.     Follow Up Recommendations SNF    Equipment Recommendations  Other (comment)(Equipment needs better determined by next facility)    Recommendations for Other Services       Precautions / Restrictions Precautions Precautions: Fall Precaution Comments: s/p R BKA Restrictions Weight Bearing Restrictions: Yes RLE Weight Bearing: Non weight bearing Other Position/Activity Restrictions: s/p R BKA      Mobility  Bed Mobility Overal bed mobility:  Modified Independent             General bed mobility comments: Patient is able to perform all bed mobility independently and without cueing; however requires increased time to complete tasks 2/2 to fatigue and decreased SPO2.  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  Ambulation/Gait Ambulation/Gait assistance: (Unable to assess)              Stairs            Wheelchair Mobility    Modified Rankin (Stroke Patients Only)       Balance Overall balance assessment: Needs assistance Sitting-balance support: Feet supported Sitting balance-Leahy Scale: Fair Sitting balance - Comments: Patient able to maintain balance with A/P challenge but falters with weight shifts requiring UE support.                                     Pertinent Vitals/Pain Pain Assessment: No/denies pain    Home Living Family/patient expects to be discharged to:: Skilled nursing facility Living Arrangements: Alone Available Help at Discharge: Skilled Nursing Facility(Patient denies having the necessary assistance at his prior residence available at this time.)  Prior Function Level of Independence: Needs assistance   Gait / Transfers Assistance Needed: Patient states he has not been able to walk but a few steps for the past two weeks. Prior to that was ambulating with a SPC for the past 10 yrs.  ADL's / Homemaking Assistance Needed: Pt reports he has a friend come assist w/ ADL and IADL daily for a couple hours (for past several years). Pt reports she will not be available to assist anymore  after return home. Pt denies medication mgt difficulty or additional falls aside from 1 prior to hospitalization.        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 tasks    Lower Extremity Assessment Lower Extremity Assessment: Generalized weakness;RLE deficits/detail RLE Deficits / Details: s/p BKA RLE: Unable to fully assess due to pain       Communication   Communication: No difficulties  Cognition Arousal/Alertness: Awake/alert Behavior During Therapy: WFL for tasks assessed/performed Overall Cognitive Status: Within Functional Limits for tasks assessed                                        General Comments      Exercises Amputee Exercises Quad Sets: Both;5 reps;Seated Gluteal Sets: Both;Seated;5 reps Other Exercises Other Exercises: bed mobility (supine <>sit), SBA, requires increased time 2/2 to fatigue Other Exercises: EOB balance, LLE supported, BUE intermittent support. Dual task with head turns. No LOB. Verbal cues required to maintain erect posture for improved oxygenation. Other Exercises: Sit <> stand, RW, +2 max assist. Patient unable to negotiate safe foot placement on LLE. Controlled descent to EOB. Other Exercises: pt instructed in desensitization strategies for stump, RLE positioning to maximize knee extension, and cognitive behavioral pain coping strategies    Assessment/Plan    PT Assessment Patient needs continued PT services  PT Problem List Decreased strength;Decreased mobility;Decreased safety awareness;Decreased range of motion;Decreased activity  tolerance;Cardiopulmonary status limiting activity;Decreased balance;Decreased knowledge of use of DME       PT Treatment Interventions DME instruction;Therapeutic activities;Gait training;Therapeutic exercise;Patient/family education;Balance training;Neuromuscular re-education;Manual techniques;Functional mobility training    PT Goals (Current goals can be found in the Care Plan section)  Acute Rehab PT Goals Patient Stated Goal: to be more independent    Frequency 7X/week   Barriers to discharge   Patient expresses concern regarding limitations of insurance with respect to his discharge location.    Co-evaluation               AM-PAC PT "6 Clicks" Mobility  Outcome Measure Help needed turning from your back to your side while in a flat bed without using bedrails?: None Help needed moving from lying on your back to sitting on the side of a flat bed without using bedrails?: A Lot Help needed moving to and from a bed to a chair (including a wheelchair)?: Total Help needed standing up from a chair using your arms (e.g., wheelchair or bedside chair)?: Total Help needed to walk in hospital room?: Total Help needed climbing 3-5 steps with a railing? : Total 6 Click Score: 10  End of Session Equipment Utilized During Treatment: Gait belt Activity Tolerance: Patient limited by fatigue;Patient tolerated treatment well;Treatment limited secondary to medical complications (Comment) Patient left: in bed;with call bell/phone within reach;with bed alarm set   PT Visit Diagnosis: Unsteadiness on feet (R26.81);Muscle weakness (generalized) (M62.81);Difficulty in walking, not elsewhere classified (R26.2);Other abnormalities of gait and mobility (R26.89)    Time: 3235-5732 PT Time Calculation (min) (ACUTE ONLY): 51 min   Charges:   PT Evaluation $PT Eval Moderate Complexity: 1 Mod PT Treatments $Therapeutic Exercise: 8-22 mins       Myles Gip PT, DPT [phone] 11/03/2018, 3:43  PM

## 2018-11-03 NOTE — Progress Notes (Signed)
CCMD alerted writer, patient had shown ventricular tachycardia on heart monitor. Writer assessed patient, patient is asymptomatic, vitals WNL, oxygen at 95% on 4 liters nasal canula. MD notified. Will continue to monitor.

## 2018-11-03 NOTE — Progress Notes (Signed)
[facility] Daily Progress Note   Subjective: 1 Day Post-Op: Right below-the-knee amputation  Patient briefly transferred to ICU to monitor breathing s/p surgery. Now on surgical floor without issue.   Objective: Vitals:   11/03/18 0559 11/03/18 0719 11/03/18 0737 11/03/18 0851  BP: 105/74 99/67    Pulse: 68 65  74  Resp: (!) 22  18   Temp: 98.6 F (37 C) 97.9 F (36.6 C)    TempSrc: Oral Oral    SpO2: 95% 94%  94%  Weight:      Height:        Intake/Output Summary (Last 24 hours) at 11/03/2018 1018 Last data filed at 11/03/2018 0919 Gross per 24 hour  Intake 2140.47 ml  Output 1160 ml  Net 980.47 ml   Physical Exam: A&Ox3, NAD CV: RRR Pulmonary: CTA Bilaterally Abdomen: Soft, Nontender, Nondistended Vascular:  Right Lower Extremity: Thigh soft, calf soft. Knee flexible at joint. Dressing dry.    Laboratory: CBC    Component Value Date/Time   WBC 8.6 11/03/2018 0452   HGB 9.6 (L) 11/03/2018 0452   HGB 13.1 01/31/2014 1335   HCT 31.3 (L) 11/03/2018 0452   HCT 39.2 (L) 01/31/2014 1335   PLT 258 11/03/2018 0452   PLT 391 01/31/2014 1335   BMET    Component Value Date/Time   NA 136 11/03/2018 0452   NA 135 (L) 01/31/2014 1335   K 4.7 11/03/2018 0452   K 3.5 01/31/2014 1335   CL 109 11/03/2018 0452   CL 101 01/31/2014 1335   CO2 19 (L) 11/03/2018 0452   CO2 24 01/31/2014 1335   GLUCOSE 131 (H) 11/03/2018 0452   GLUCOSE 106 (H) 01/31/2014 1335   BUN 38 (H) 11/03/2018 0452   BUN 14 01/31/2014 1335   CREATININE 1.12 11/03/2018 0452   CREATININE 0.81 01/31/2014 1335   CALCIUM 7.5 (L) 11/03/2018 0452   CALCIUM 7.8 (L) 01/31/2014 1335   GFRNONAA >60 11/03/2018 0452   GFRNONAA >60 01/31/2014 1335   GFRAA >60 11/03/2018 0452   GFRAA >60 01/31/2014 1335   Assessment/Planning: The patient is a 63 year old male s/p right BKA - POD#1 1) Gram drop in H&H (9.6) - asymptomatic. Dressing is dry. CBC in AM. 2) PT / OT 3) Social work consult  for probably dispo to SNF 4) Pain control seems to be adequate on current regimen  Discussed with Dr. Wallis Mart  PA-C 11/03/2018 10:18 AM

## 2018-11-04 LAB — CULTURE, BLOOD (ROUTINE X 2)
Culture: NO GROWTH
Culture: NO GROWTH
Special Requests: ADEQUATE

## 2018-11-04 LAB — CBC
HCT: 33.5 % — ABNORMAL LOW (ref 39.0–52.0)
Hemoglobin: 10.3 g/dL — ABNORMAL LOW (ref 13.0–17.0)
MCH: 27.8 pg (ref 26.0–34.0)
MCHC: 30.7 g/dL (ref 30.0–36.0)
MCV: 90.3 fL (ref 80.0–100.0)
Platelets: 302 10*3/uL (ref 150–400)
RBC: 3.71 MIL/uL — ABNORMAL LOW (ref 4.22–5.81)
RDW: 15 % (ref 11.5–15.5)
WBC: 9.8 10*3/uL (ref 4.0–10.5)
nRBC: 0 % (ref 0.0–0.2)

## 2018-11-04 LAB — BASIC METABOLIC PANEL
Anion gap: 5 (ref 5–15)
BUN: 40 mg/dL — ABNORMAL HIGH (ref 8–23)
CO2: 21 mmol/L — ABNORMAL LOW (ref 22–32)
Calcium: 7.7 mg/dL — ABNORMAL LOW (ref 8.9–10.3)
Chloride: 112 mmol/L — ABNORMAL HIGH (ref 98–111)
Creatinine, Ser: 1.05 mg/dL (ref 0.61–1.24)
GFR calc Af Amer: >60 mL/min (ref >60)
GFR calc non Af Amer: >60 mL/min (ref >60)
Glucose, Bld: 90 mg/dL (ref 70–99)
Potassium: 4.1 mmol/L (ref 3.5–5.1)
Sodium: 138 mmol/L (ref 135–145)

## 2018-11-04 LAB — SURGICAL PATHOLOGY

## 2018-11-04 LAB — MAGNESIUM: Magnesium: 2.1 mg/dL (ref 1.7–2.4)

## 2018-11-04 MED ORDER — FUROSEMIDE 10 MG/ML IJ SOLN
40.0000 mg | Freq: Once | INTRAMUSCULAR | Status: AC
  Administered 2018-11-04: 11:00:00 40 mg via INTRAVENOUS
  Filled 2018-11-04: qty 4

## 2018-11-04 NOTE — Progress Notes (Signed)
PT Cancellation Note  Patient Details Name: Douglas Jarvis MRN: [id number] DOB: 1956-01-21   Cancelled Treatment:    Reason Eval/Treat Not Completed: Patient declined, no reason specified Patient refused therapy with complaints of anxiety, reports of difficulty with oxygen/breathing that have persisted over the course of the day. Patient stated "this is just really not a good day." PT asked if patient feels the anxiety medication he was given is helping and patient articulated that it is. PT negotiated with patient for a session later this afternoon and patient agreed.  Sheria Lang PT, DPT [phone] 11/04/2018, 2:53 PM

## 2018-11-04 NOTE — TOC Initial Note (Signed)
Transition of Care [facility]) - Initial/Assessment Note    Patient Details  Name: Douglas Jarvis MRN: [id number] Date of Birth: 11-19-1955  Transition [facility]) CM/SW Contact:    York Spaniel, LCSW Phone Number: 11/04/2018, 3:26 PM  Clinical Narrative:                CSW notified this morning that PT had recommended short term rehab. Patient is listed as having no insurance. When CSW spoke with patient, he stated that he does have [employer]. After much work, it was found that patient does have an insurance card but it is not here and his brother will have to bring it. The Member ID: [id number] and Group #: [id number] with provider line being: [phone].   Bed search was initiated but so far no offers in [facility]. Patient agreeable to [facility] so referral sent there. CSW will need to follow up on bed offers if there are any. Patient's pasrr has been initiated. He may be a level 2 because of his recent suicide attempt.   Expected Discharge Plan: Skilled Nursing Facility Barriers to Discharge: No SNF bed   Patient Goals and CMS Choice Patient states their goals for this hospitalization and ongoing recovery are:: improve walking CMS Medicare.gov Compare Post Acute Care list provided to:: Patient Choice offered to / list presented to : Patient  Expected Discharge Plan and Services Expected Discharge Plan: Skilled Nursing Facility       Living arrangements for the past 2 months: Single Family Home                          Prior Living Arrangements/Services Living arrangements for the past 2 months: Single Family Home Lives with:: Self Patient language and need for interpreter reviewed:: Yes Do you feel safe going back to the place where you live?: Yes      Need for Family Participation in Patient Care: Yes (Comment) Care giver support system in place?: No (comment)   Criminal Activity/Legal Involvement Pertinent to Current Situation/Hospitalization: No -  Comment as needed  Activities of Daily Living Home Assistive Devices/Equipment: Cane (specify quad or straight), Walker (specify type) ADL Screening (condition at time of admission) Patient's cognitive ability adequate to safely complete daily activities?: Yes Is the patient deaf or have difficulty hearing?: No Does the patient have difficulty seeing, even when wearing glasses/contacts?: No Does the patient have difficulty concentrating, remembering, or making decisions?: No Patient able to express need for assistance with ADLs?: Yes Does the patient have difficulty dressing or bathing?: Yes Independently performs ADLs?: No Does the patient have difficulty walking or climbing stairs?: Yes Weakness of Legs: Both Weakness of Arms/Hands: None  Permission Sought/Granted Permission sought to share information with : Facility Medical sales representative                Emotional Assessment Appearance:: Appears older than stated age   Affect (typically observed): Anxious, Calm Orientation: : Oriented to Self, Oriented to Place, Oriented to  Time Alcohol / Substance Use: Not Applicable Psych Involvement: No (comment)  Admission diagnosis:  Acute respiratory failure with hypoxia (HCC) [J96.01] Gangrene of right foot [facility]) [I96] Patient Active Problem List   Diagnosis Date Noted  . Gangrene of right foot (HCC) 10/30/2018   PCP:  [name], MD Pharmacy:   [id number] - [place], [place] - [address] AT [address]EC OF [address] & [address]  [place] [address] Phone: [phone] Fax: [phone]     Social Determinants of Health (SDOH) Interventions    Readmission Risk Interventions No flowsheet data found.

## 2018-11-04 NOTE — Progress Notes (Signed)
[facility] Daily Progress Note   Subjective: 2 Days Post-Op: Right below-the-knee amputation  Patient without complaint. No issues overnight.   Objective: Vitals:   11/03/18 1447 11/03/18 1511 11/03/18 2048 11/04/18 0627  BP: 108/78  94/63 97/64  Pulse: 72  73 70  Resp: 18  20 20   Temp: 97.8 F (36.6 C)  97.6 F (36.4 C) 97.6 F (36.4 C)  TempSrc: Oral  Oral Oral  SpO2: 95% 91% 91% 96%  Weight:      Height:        Intake/Output Summary (Last 24 hours) at 11/04/2018 1005 Last data filed at 11/04/2018 0501 Gross per 24 hour  Intake 420 ml  Output 725 ml  Net -305 ml   Physical Exam: A&Ox3, NAD CV: RRR Pulmonary: CTA Bilaterally Abdomen: Soft, Nontender, Nondistended Vascular:  Right Lower Extremity: OR dressing removed. Staple line clean and dry. Stump healthy. Thigh soft. Knee flexible at joint.    Laboratory: CBC    Component Value Date/Time   WBC 9.8 11/04/2018 0506   HGB 10.3 (L) 11/04/2018 0506   HGB 13.1 01/31/2014 1335   HCT 33.5 (L) 11/04/2018 0506   HCT 39.2 (L) 01/31/2014 1335   PLT 302 11/04/2018 0506   PLT 391 01/31/2014 1335   BMET    Component Value Date/Time   NA 138 11/04/2018 0506   NA 135 (L) 01/31/2014 1335   K 4.1 11/04/2018 0506   K 3.5 01/31/2014 1335   CL 112 (H) 11/04/2018 0506   CL 101 01/31/2014 1335   CO2 21 (L) 11/04/2018 0506   CO2 24 01/31/2014 1335   GLUCOSE 90 11/04/2018 0506   GLUCOSE 106 (H) 01/31/2014 1335   BUN 40 (H) 11/04/2018 0506   BUN 14 01/31/2014 1335   CREATININE 1.05 11/04/2018 0506   CREATININE 0.81 01/31/2014 1335   CALCIUM 7.7 (L) 11/04/2018 0506   CALCIUM 7.8 (L) 01/31/2014 1335   GFRNONAA >60 11/04/2018 0506   GFRNONAA >60 01/31/2014 1335   GFRAA >60 11/04/2018 0506   GFRAA >60 01/31/2014 1335   Assessment/Planning: The patient is a 63 year old male s/p right BKA - POD#2 1) Hbg: Stable 2) PT / OT 3) Social work consult for probably dispo to SNF 4) Pain control seems to  be adequate on current regimen  Discussed with Dr. Wallis Mart Shaw Dobek PA-C 11/04/2018 10:05 AM

## 2018-11-04 NOTE — Progress Notes (Signed)
PT Cancellation Note  Patient Details Name: Douglas Jarvis MRN: [id number] DOB: 1955-08-16   Cancelled Treatment:    Reason Eval/Treat Not Completed: Fatigue/lethargy limiting ability to participate Patient declined PT this AM 2/2 to fatigue and emotional distress regarding insurance/discharge concerns. Patient articulated that he will be willing to participate this afternoon. PT instructed patient to keep residual limb propped on pillow to promote full knee extension. Patient refused pillow. PT will attempt tx later today.  Sheria Lang PT, DPT [phone] 11/04/2018, 10:14 AM

## 2018-11-04 NOTE — Progress Notes (Signed)
Nutrition Follow Up Note   DOCUMENTATION CODES:   Not applicable  INTERVENTION:   Ensure Max protein supplement BID, each supplement provides 150kcal and 30g of protein.  Ocuvite daily for wound healing (provides zinc, vitamin A, vitamin C, Vitamin E, copper, and selenium)  Vitamin C 250mg  po BID  NUTRITION DIAGNOSIS:   Increased nutrient needs related to wound healing as evidenced by increased estimated needs  GOAL:   Patient will meet greater than or equal to 90% of their needs  -progressing   MONITOR:   PO intake, Supplement acceptance, Labs, Weight trends, Skin, I & O's  ASSESSMENT:   63 year old male with a past medical history of renal disorder, hypertension, congestive heart failure, active tobacco abuse, unsuccessful suicide attempt who presented to the [facility] emergency department with a chief complaint of "fever".  Pt found to have a right ischemic lower extremity with gangrene  RD working remotely.  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 Pt continues to do well; pt eating 100% of meals but refusing most supplements. Recommend encourage intake of supplements and continue vitamins to support wound healing. No new weight s/p amputation; will request weekly weights.   Medications reviewed and include: aspirin, plavix, lovenox, ocuvite, vitamin C  Labs reviewed: K 4.1 wnl, Mg 2.1 wnl Hgb 10.3(L), Hct 33.5(L)  Diet Order:   Diet Order            Diet Heart Room service appropriate? Yes; Fluid consistency: Thin  Diet effective now             EDUCATION NEEDS:   Not appropriate for education at this time  Skin:  Skin Assessment: Reviewed RN Assessment(gangrene R foot )  Last BM:  4/17- type 6  Height:   Ht Readings from Last 1 Encounters:  11/02/18 5\' 10"  (1.778 m)    Weight:   Wt Readings from Last 1 Encounters:  11/02/18 81.4 kg    Ideal Body Weight:  75.4 kg  BMI:  Body mass index is 25.74 kg/m.  Estimated Nutritional Needs:    Kcal:  1900-2200kcal/day   Protein:  90-106g/day  Fluid:  >1.9L/day   Betsey Holiday MS, RD, LDN Pager #- [phone] Office#- [phone] After Hours Pager: [phone]

## 2018-11-04 NOTE — Progress Notes (Signed)
Physical Therapy Treatment Patient Details Name: Douglas Jarvis MRN: [id number] DOB: 10/27/1955 Today's Date: 11/04/2018    History of Present Illness Patient has known history of systolic congestive heart failure and hypertension and presented to ED and was found to have right foot gangrene and congestive heart failure acute on chronic systolic, as well as experiencing respiratory distress. 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 Patient on 4L O2 via [place].    PT Comments    Patient on 2L O2 on PT arrival with SpO2 at 82% supine in bed and was amenable to therapy. Patient continues to require cueing to breathe through the nose to increase oxygenation at rest and during activity. Patient SpO2 increased to 87% after cued breaths; performed supine exercises after which patient's SpO2 decreased to 84%. PT titrated oxygen up to 4L to improve oxygenation and allow for increased participation in therapy. Patient transferred supine to sit EOB with supervision, but requires frequent cueing to maintain erect posture for improved breathing mechanics. Patient able to sit EOB with no UE support and maintain balance. Patient attempted sit<>stand transfer, Max A +2 from elevated surface. Patient unable to control hips/lumbopelvic complex and resorts to pulling strongly on RW creating an unsafe situation. Patient was able to achieve more LLE control during sit<>stand today. SpO2 after transfer attempt was 76%; patient was short of breath with increased RR. PT intermittently titrated O2 up to 6L to allow for SpO2 to return above 88%. Patient performed slideboard transfer with max cueing, Mod A x2 to transfer to chair; SpO2 74%. After quiet sitting with breathing cues, patient SpO2 increased to 89%. PT provided nursing with education on slideboard transfers at end of session and return O2 to 2L. Patient left in chair with chair alarm set and call bell in reach. All needs  met.   Follow Up Recommendations  SNF     Equipment Recommendations  Other (comment)(Equipment needs better determined by next facility)    Recommendations for Other Services       Precautions / Restrictions Precautions Precautions: Fall Precaution Comments: s/p R BKA Restrictions Weight Bearing Restrictions: Yes RLE Weight Bearing: Non weight bearing Other Position/Activity Restrictions: s/p R BKA    Mobility  Bed Mobility                  Transfers                    Ambulation/Gait                 Stairs             Wheelchair Mobility    Modified Rankin (Stroke Patients Only)       Balance                                            Cognition Arousal/Alertness: Awake/alert Behavior During Therapy: WFL for tasks assessed/performed Overall Cognitive Status: Within Functional Limits for tasks assessed                                        Exercises Amputee Exercises Quad Sets: 10 reps;Right Gluteal Sets: Both;Seated;5 reps Hip ABduction/ADduction: Strengthening;Right;10 reps Hip Flexion/Marching: Left;Seated;10 reps Knee Flexion: Both;Strengthening;10 reps Knee Extension:  Left;Strengthening;10 reps Straight Leg Raises: Strengthening Other Exercises Other Exercises: bed mobility (supine <>sit), SBA, requires increased time 2/2 to fatigue Other Exercises: EOB balance, LLE supported, BUE resting in lap. Verbal cues required to maintain erect posture for improved oxygenation. Other Exercises: Sit <> stand, RW, +2 max assist. Patient able to negotiate foot placement, but unable to control hips/lumbopelvic complex for prolonged standing. Patient pulls on RW in standing despite verbal cues. Other Exercises: Instructed/performed slideboard transfer to chair, Mod A x2, max VCs for sequencing and hand placement.    General Comments        Pertinent Vitals/Pain Pain Assessment: No/denies pain     Home Living                      Prior Function            PT Goals (current goals can now be found in the care plan section) Acute Rehab PT Goals Patient Stated Goal: increase QOL PT Goal Formulation: With patient    Frequency    7X/week      PT Plan      Co-evaluation              AM-PAC PT "6 Clicks" Mobility   Outcome Measure  Help needed turning from your back to your side while in a flat bed without using bedrails?: None Help needed moving from lying on your back to sitting on the side of a flat bed without using bedrails?: A Lot Help needed moving to and from a bed to a chair (including a wheelchair)?: Total Help needed standing up from a chair using your arms (e.g., wheelchair or bedside chair)?: Total Help needed to walk in hospital room?: Total Help needed climbing 3-5 steps with a railing? : Total 6 Click Score: 10    End of Session Equipment Utilized During Treatment: Gait belt;Oxygen(4L during activity) Activity Tolerance: Patient limited by fatigue;Patient tolerated treatment well;Treatment limited secondary to medical complications (Comment) Patient left: with call bell/phone within reach;in chair;with chair alarm set Nurse Communication: Mobility status(Slideboard transfer to/from chair) PT Visit Diagnosis: Unsteadiness on feet (R26.81);Muscle weakness (generalized) (M62.81);Difficulty in walking, not elsewhere classified (R26.2);Other abnormalities of gait and mobility (R26.89)     Time: 7353-2992 PT Time Calculation (min) (ACUTE ONLY): 29 min  Charges:  $Therapeutic Exercise: 8-22 mins $Therapeutic Activity: 8-22 mins                     Myles Gip PT, DPT [phone] 11/04/2018, 4:28 PM

## 2018-11-04 NOTE — Progress Notes (Signed)
Orders given by MD to discontinue foley catheter. Orders placed and followed. Will continue to monitor output.

## 2018-11-04 NOTE — Progress Notes (Signed)
Occupational Therapy Treatment Patient Details Name: Sesar Bossio MRN: [id number] DOB: 09/02/1955 Today's Date: 11/04/2018    History of present illness Patient is POD#1 s/p R BKA 2/2 gangrene. PMHx includes systolic congestive heart failure and hypertension. Patient on 4L O2 via [place].   OT comments  Pt seen for OT tx this date. Pt reporting mild residual limb pain and verbalized concern about insurance and whether it would cover STR. Emotional support and active listening provided. Pt appreciative. Pt instructed in energy conservation strategies to maximize safety and independence during ADL and functional mobility while minimizing risk of over exertion and falls; handout provided. Pt verbalized understanding. Pt continues to benefit from skilled OT services to maximize return to PLOF with improved independence and safety.    Follow Up Recommendations  SNF    Equipment Recommendations  3 in 1 bedside commode;Other (comment)(reacher, sock aid, LH sponge, LH shoe horn, elastic shoe laces for L shoe)    Recommendations for Other Services      Precautions / Restrictions Precautions Precautions: Fall Precaution Comments: s/p R BKA Restrictions Weight Bearing Restrictions: Yes RLE Weight Bearing: Non weight bearing Other Position/Activity Restrictions: s/p R BKA       Mobility Bed Mobility                  Transfers                      Balance                                           ADL either performed or assessed with clinical judgement   ADL Overall ADL's : Needs assistance/impaired                                       General ADL Comments: max A for LB ADL      Vision Baseline Vision/History: Wears glasses Wears Glasses: At all times Patient Visual Report: No change from baseline     Perception     Praxis      Cognition Arousal/Alertness: Awake/alert Behavior During Therapy: WFL for tasks  assessed/performed Overall Cognitive Status: Within Functional Limits for tasks assessed                                          Exercises Other Exercises Other Exercises: Pt instructed in energy conservation strategies to maximize safety and independence during ADL and functional mobility while minimizing risk of over exertion and falls; handout provided.    Shoulder Instructions       General Comments      Pertinent Vitals/ Pain       Pain Assessment: 0-10 Pain Score: 2  Pain Location: residual limb Pain Descriptors / Indicators: Aching Pain Intervention(s): Limited activity within patient's tolerance;Monitored during session;Repositioned  Home Living                                          Prior Functioning/Environment              Frequency  Progress Toward Goals  OT Goals(current goals can now be found in the care plan section)  Progress towards OT goals: Progressing toward goals  Acute Rehab OT Goals Patient Stated Goal: to be more independent Time For Goal Achievement: 11/17/18 Potential to Achieve Goals: Good  Plan Discharge plan remains appropriate;Frequency remains appropriate    Co-evaluation                 AM-PAC OT "6 Clicks" Daily Activity     Outcome Measure   Help from another person eating meals?: None Help from another person taking care of personal grooming?: None Help from another person toileting, which includes using toliet, bedpan, or urinal?: A Lot Help from another person bathing (including washing, rinsing, drying)?: A Lot Help from another person to put on and taking off regular upper body clothing?: A Little Help from another person to put on and taking off regular lower body clothing?: A Lot 6 Click Score: 17    End of Session    OT Visit Diagnosis: Other abnormalities of gait and mobility (R26.89);History of falling (Z91.81);Muscle weakness (generalized)  (M62.81);Pain Pain - Right/Left: Right Pain - part of body: Knee;Leg   Activity Tolerance Patient tolerated treatment well   Patient Left in bed;with call bell/phone within reach;with bed alarm set   Nurse Communication          Time: [id number] OT Time Calculation (min): 21 min  Charges: OT Treatments $Self Care/Home Management : 8-22 mins  Richrd PrimeJamie Stiller, MPH, MS, OTR/L ascom [phone] 11/04/18, 10:01 AM

## 2018-11-04 NOTE — NC FL2 (Signed)
[place] MEDICAID FL2 LEVEL OF CARE SCREENING TOOL     IDENTIFICATION  Patient Name: Douglas Jarvis Birthdate: 1956-04-10 Sex: male Admission Date (Current Location): 10/30/2018  [place] and [place] Number:  Chiropodist and Address:  [facility], [address], [place], [place] [address]      Provider Number: [id number]  Attending Physician Name and Address:  Adrian Saran, MD  Relative Name and Phone Number:       Current Level of Care: Hospital Recommended Level of Care: Skilled Nursing Facility Prior Approval Number:    Date Approved/Denied:   PASRR Number:    Discharge Plan: SNF    Current Diagnoses: Patient Active Problem List   Diagnosis Date Noted  . Gangrene of right foot (HCC) 10/30/2018    Orientation RESPIRATION BLADDER Height & Weight     Self, Time, Situation, Place  Normal, O2(3) Continent Weight: 179 lb 6.4 oz (81.4 kg) Height:  5\' 10"  (177.8 cm)  BEHAVIORAL SYMPTOMS/MOOD NEUROLOGICAL BOWEL NUTRITION STATUS  (none) (none) Continent    AMBULATORY STATUS COMMUNICATION OF NEEDS Skin   Extensive Assist Verbally (new amputation)                       Personal Care Assistance Level of Assistance  Bathing, Dressing Bathing Assistance: Maximum assistance   Dressing Assistance: Maximum assistance     Functional Limitations Info  Sight Sight Info: Impaired(wears glasses)        SPECIAL CARE FACTORS FREQUENCY  PT (By licensed PT)                    Contractures Contractures Info: Not present    Additional Factors Info  Code Status Code Status Info: dnr             Current Medications (11/04/2018):  This is the current hospital active medication list Current Facility-Administered Medications  Medication Dose Route Frequency Provider Last Rate Last Dose  . 0.9 %  sodium chloride infusion   Intravenous PRN Annice Needy, MD   Stopped at 11/02/18 1012  . acetaminophen (TYLENOL) tablet 650 mg   650 mg Oral Q6H PRN Annice Needy, MD   650 mg at 10/31/18 0309   Or  . acetaminophen (TYLENOL) suppository 650 mg  650 mg Rectal Q6H PRN Annice Needy, MD      . aspirin EC tablet 81 mg  81 mg Oral Daily Annice Needy, MD   81 mg at 11/04/18 0915  . chlorhexidine (PERIDEX) 0.12 % solution 15 mL  15 mL Mouth Rinse BID Merwyn Katos, MD   15 mL at 11/04/18 0914  . clopidogrel (PLAVIX) tablet 75 mg  75 mg Oral Daily Annice Needy, MD   75 mg at 11/04/18 0914  . enoxaparin (LOVENOX) injection 40 mg  40 mg Subcutaneous Q24H Adrian Saran, MD   40 mg at 11/04/18 0914  . ipratropium-albuterol (DUONEB) 0.5-2.5 (3) MG/3ML nebulizer solution 3 mL  3 mL Nebulization Once Annice Needy, MD      . ketorolac (TORADOL) 15 MG/ML injection 15 mg  15 mg Intravenous Q6H Annice Needy, MD   15 mg at 11/04/18 1117  . MEDLINE mouth rinse  15 mL Mouth Rinse q12n4p Merwyn Katos, MD   15 mL at 11/03/18 1720  . multivitamin-lutein (OCUVITE-LUTEIN) capsule 1 capsule  1 capsule Oral Daily Annice Needy, MD   1 capsule at 11/04/18 1116  .  ondansetron (ZOFRAN) tablet 4 mg  4 mg Oral Q6H PRN Annice Needyew, Jason S, MD       Or  . ondansetron (ZOFRAN) injection 4 mg  4 mg Intravenous Q6H PRN Annice Needyew, Jason S, MD      . polyethylene glycol (MIRALAX / GLYCOLAX) packet 17 g  17 g Oral Daily PRN Annice Needyew, Jason S, MD      . protein supplement (ENSURE MAX) liquid  11 oz Oral BID Annice Needyew, Jason S, MD   11 oz at 11/01/18 1132  . sertraline (ZOLOFT) tablet 50 mg  50 mg Oral Daily Antonieta Pertlary, Greg Lawson, MD   50 mg at 11/04/18 0915  . traMADol (ULTRAM) tablet 50 mg  50 mg Oral Q6H PRN Annice Needyew, Jason S, MD   50 mg at 11/04/18 0739  . vitamin C (ASCORBIC ACID) tablet 250 mg  250 mg Oral BID Annice Needyew, Jason S, MD   250 mg at 11/04/18 0915     Discharge Medications: Please see discharge summary for a list of discharge medications.  Relevant Imaging Results:  Relevant Lab Results:   Additional Information ss: [id number]  [facility] Info[facility] [id number] and  Group #: [id number] Provider Number: [phone]  York SpanielMonica Oneal Schoenberger, LCSW

## 2018-11-04 NOTE — Progress Notes (Signed)
[facility] [place] [facility]   PATIENT NAME: Douglas Jarvis    MR#:  [id number]  DATE OF BIRTH:  [birth year]  SUBJECTIVE:   Patient without any issues overnight Denies SI  REVIEW OF SYSTEMS:    Review of Systems  Constitutional: Negative.  Negative for chills, fever and malaise/fatigue.  HENT: Negative.  Negative for ear discharge, ear pain, hearing loss, nosebleeds and sore throat.   Eyes: Negative.  Negative for blurred vision and pain.  Respiratory: Negative.  Negative for cough, hemoptysis, shortness of breath and wheezing.   Cardiovascular: Negative.  Negative for chest pain, palpitations and leg swelling.  Gastrointestinal: Negative.  Negative for abdominal pain, blood in stool, diarrhea, nausea and vomiting.  Genitourinary: Negative.  Negative for dysuria.  Musculoskeletal: Negative for back pain.          Skin: Negative.   Neurological: Negative for dizziness, tremors, speech change, focal weakness, seizures and headaches.  Endo/Heme/Allergies: Negative.  Does not bruise/bleed easily.  Psychiatric/Behavioral: Negative.  Negative for depression, hallucinations and suicidal ideas.      Tolerating Diet: yes     DRUG ALLERGIES:   Allergies  Allergen Reactions  . Penicillins Shortness Of Breath  . Sulfa Antibiotics Other (See Comments)    Unsure of reaction as "it has been so long"    VITALS:  Blood pressure 97/64, pulse 70, temperature 97.6 F (36.4 C), temperature source Oral, resp. rate 20, height 5\' 10"  (1.778 m), weight 81.4 kg, SpO2 96 %.  PHYSICAL EXAMINATION:  Constitutional: Appears moderate distress with dyspnea and agitation. HENT: Normocephalic. Marland Kitchen. Nonrebreather  eyes: Conjunctivae and EOM are normal. PERRLA, no scleral icterus.  Neck: Normal ROM. Neck supple. No JVD. No tracheal deviation. CVS: RRR, S1/S2 +, no murmurs, no gallops, no carotid bruit.  Pulmonary: Effort and breath sounds normal, no stridor, rhonchi, wheezes,  rales.  Abdominal: Soft. BS +,  no distension, tenderness, rebound or guarding.  Musculoskeletal: Right BKA Neuro: Alert.  Skin: Right BKA bandaged/covered   psychiatric: Agitated     LABORATORY PANEL:   CBC Recent Labs  Lab 11/04/18 0506  WBC 9.8  HGB 10.3*  HCT 33.5*  PLT 302   ------------------------------------------------------------------------------------------------------------------  Chemistries  Recent Labs  Lab 10/30/18 2046  11/04/18 0506  NA 134*   < > 138  K 2.8*   < > 4.1  CL 100   < > 112*  CO2 22   < > 21*  GLUCOSE 113*   < > 90  BUN 14   < > 40*  CREATININE 0.65   < > 1.05  CALCIUM 7.3*   < > 7.7*  MG  --    < > 2.1  AST 81*  --   --   ALT 41  --   --   ALKPHOS 97  --   --   BILITOT 1.2  --   --    < > = values in this interval not displayed.   ------------------------------------------------------------------------------------------------------------------  Cardiac Enzymes No results for input(s): TROPONINI in the last 168 hours. ------------------------------------------------------------------------------------------------------------------  RADIOLOGY:  Dg Chest 1 View  Result Date: 11/03/2018 CLINICAL DATA:  Pneumonia EXAM: CHEST  1 VIEW COMPARISON:  11/02/2018 FINDINGS: Diffuse bilateral airspace opacities are stable on the left and improved in the right lung. Normal heart size. Low lung volumes. No pneumothorax or pleural effusion. IMPRESSION: Bilateral airspace opacities are stable on the left and improved on the right. Electronically Signed   By: Jolaine ClickArthur  Hoss  M.D.   On: 11/03/2018 09:00     ASSESSMENT AND PLAN:   63 year old male with tobacco dependence, chronic diastolic heart failure and peripheral arterial disease presented to the hospital due to right foot gangrene.  1.  PAD with ischemic right lower extremity:   Revascularization was attempted however this was unsuccessful.  He is not a candidate for bypass due to extent  of disease.  He is postoperative day #2 right lower extremity BKA. Continue Plavix Plan for SNF at discharge.   Xeroform and kerlex wrap PRN soiled  2.  Acute hypoxic respiratory failure in the setting of progressive interstitial lung disease/pulmonary edema with acute on chronic diastolic heart failure: Patient has improved  One-time dose of IV Lasix again this morning. Wean oxygen as tolerated  3.  Suicidal attempt with overdose of Coreg about 2 weeks ago:  IVC discontinued as well as sitter by psychiatry  continue Zoloft Psychiatry consult appreciated.   4.  Asymptomatic V. tach: No further episodes  continue telemetry  Management plans discussed with the patient  CODE STATUS: DNR  TOTAL TIME TAKING CARE OF THIS PATIENT: 24 minutes.   Clinical social worker consulted for placement  POSSIBLE D/C tomorrow, DEPENDING ON CLINICAL CONDITION.   Adrian Saran M.D on 11/04/2018 at 11:04 AM  Between 7am to 6pm - Pager - [phone] After 6pm go to [email or web address] - password [employer]  [facility]  Office  [phone]  CC: Primary care physician; [name], MD  Note: This dictation was prepared with Dragon dictation along with smaller phrase technology. Any transcriptional errors that result from this process are unintentional.

## 2018-11-05 LAB — BASIC METABOLIC PANEL
Anion gap: 7 (ref 5–15)
BUN: 31 mg/dL — ABNORMAL HIGH (ref 8–23)
CO2: 21 mmol/L — ABNORMAL LOW (ref 22–32)
Calcium: 7.6 mg/dL — ABNORMAL LOW (ref 8.9–10.3)
Chloride: 110 mmol/L (ref 98–111)
Creatinine, Ser: 0.82 mg/dL (ref 0.61–1.24)
GFR calc Af Amer: >60 mL/min (ref >60)
GFR calc non Af Amer: >60 mL/min (ref >60)
Glucose, Bld: 96 mg/dL (ref 70–99)
Potassium: 3.6 mmol/L (ref 3.5–5.1)
Sodium: 138 mmol/L (ref 135–145)

## 2018-11-05 MED ORDER — FUROSEMIDE 10 MG/ML IJ SOLN: 40.0000 mg | Freq: Once | INTRAMUSCULAR | Status: DC

## 2018-11-05 MED ORDER — POTASSIUM CHLORIDE CRYS ER 20 MEQ PO TBCR: 40.0000 meq | EXTENDED_RELEASE_TABLET | Freq: Once | ORAL | Status: DC

## 2018-11-05 NOTE — Progress Notes (Signed)
3 Days Post-Op   Subjective/Chief Complaint: Doing OK. Without complaint. Denies pain.   Objective: Vital signs in last 24 hours: Temp:  [97.6 F (36.4 C)-97.8 F (36.6 C)] 97.8 F (36.6 C) (04/18 0423) Pulse Rate:  [79-84] 79 (04/18 0423) Resp:  [18-20] 20 (04/18 0423) BP: (108-116)/(69-76) 108/76 (04/18 0423) SpO2:  [87 %-94 %] 93 % (04/18 0423) Weight:  [78.9 kg] 78.9 kg (04/17 1456) Last BM Date: 11/04/18  Intake/Output from previous day: 04/17 0701 - 04/18 0700 In: 240 [P.O.:240] Out: 1530 [Urine:1530] Intake/Output this shift: No intake/output data recorded.  General appearance: alert and no distress Cardio: regular rate and rhythm Extremities: RIGHT BKA site- soft, incision- C/D/I, viable  Lab Results:  Recent Labs    11/03/18 0452 11/04/18 0506  WBC 8.6 9.8  HGB 9.6* 10.3*  HCT 31.3* 33.5*  PLT 258 302   BMET Recent Labs    11/04/18 0506 11/05/18 0514  NA 138 138  K 4.1 3.6  CL 112* 110  CO2 21* 21*  GLUCOSE 90 96  BUN 40* 31*  CREATININE 1.05 0.82  CALCIUM 7.7* 7.6*   PT/INR No results for input(s): LABPROT, INR in the last 72 hours. ABG No results for input(s): PHART, HCO3 in the last 72 hours.  Invalid input(s): PCO2, PO2  Studies/Results: No results found.  Anti-infectives: Anti-infectives (From admission, onward)   Start     Dose/Rate Route Frequency Ordered Stop   11/01/18 1045  vancomycin (VANCOCIN) IVPB 1000 mg/200 mL premix  Status:  Discontinued     1,000 mg 200 mL/hr over 60 Minutes Intravenous Every 12 hours 11/01/18 1030 11/03/18 1059   11/01/18 1000  vancomycin (VANCOCIN) 1,000 mg in sodium chloride 0.9 % 250 mL IVPB  Status:  Discontinued     1,000 mg 250 mL/hr over 60 Minutes Intravenous Every 12 hours 11/01/18 0900 11/01/18 1030   10/31/18 1000  vancomycin (VANCOCIN) 1,250 mg in sodium chloride 0.9 % 250 mL IVPB  Status:  Discontinued     1,250 mg 166.7 mL/hr over 90 Minutes Intravenous Every 12 hours 10/31/18 0454  11/01/18 0900   10/31/18 0600  ceFEPIme (MAXIPIME) 2 g in sodium chloride 0.9 % 100 mL IVPB  Status:  Discontinued     2 g 200 mL/hr over 30 Minutes Intravenous Every 8 hours 10/31/18 0454 11/03/18 1059   10/31/18 0600  metroNIDAZOLE (FLAGYL) IVPB 500 mg  Status:  Discontinued     500 mg 100 mL/hr over 60 Minutes Intravenous Every 8 hours 10/31/18 0506 11/03/18 1059   10/30/18 2200  vancomycin (VANCOCIN) 2,000 mg in sodium chloride 0.9 % 500 mL IVPB     2,000 mg 250 mL/hr over 120 Minutes Intravenous  Once 10/30/18 2153 10/31/18 0011   10/30/18 2100  ceFEPIme (MAXIPIME) 2 g in sodium chloride 0.9 % 100 mL IVPB     2 g 200 mL/hr over 30 Minutes Intravenous  Once 10/30/18 2057 10/30/18 2201   10/30/18 2100  metroNIDAZOLE (FLAGYL) IVPB 500 mg     500 mg 100 mL/hr over 60 Minutes Intravenous  Once 10/30/18 2057 10/30/18 2313   10/30/18 2100  vancomycin (VANCOCIN) IVPB 1000 mg/200 mL premix  Status:  Discontinued     1,000 mg 200 mL/hr over 60 Minutes Intravenous  Once 10/30/18 2057 10/30/18 2153      Assessment/Plan: s/p Procedure(s): AMPUTATION BELOW KNEE (Right) Continue daily dressing changes to stump.OOB with PT Awaiting placement  LOS: 6 days    Eli Hose A 11/05/2018

## 2018-11-05 NOTE — Progress Notes (Signed)
Physical Therapy Treatment Patient Details Name: Douglas Jarvis MRN: [id number] DOB: 1955-12-04 Today's Date: 11/05/2018    History of Present Illness Patient has known history of systolic congestive heart failure and hypertension and presented to ED and was found to have right foot gangrene and congestive heart failure acute on chronic systolic, as well as experiencing respiratory distress. 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 Patient on 4L O2 via [place].    PT Comments    Participated in exercises as described below.  Desats to low 80's with ex's.  Proper breathing encouraged.  Per last PT note, pt was increased to up to 6 lpm to tolerate mobility.  Discussed with primary RN who ok'ed increase to 6lpm for mobility to allow pt to get up to recliner today.  To edge of bed with min a x 1 for safety.  Standing was deferred due to O2 sats and sliding board transfer to recliner at bedside with mod a x 2.  Once in recliner, sats low 80's on 6 lpm and slowly climbed to 92% at rest.  RN aware.  Pt left on 6 lpm and RN to come and check.  Pt fatigued with effort but O2 remains primary barrier at this time.    Follow Up Recommendations  SNF     Equipment Recommendations       Recommendations for Other Services       Precautions / Restrictions Precautions Precautions: Fall Precaution Comments: s/p R BKA Restrictions Weight Bearing Restrictions: Yes RLE Weight Bearing: Non weight bearing Other Position/Activity Restrictions: s/p R BKA    Mobility  Bed Mobility Overal bed mobility: Needs Assistance Bed Mobility: Supine to Sit           General bed mobility comments: min a x 1 required today for safety  Transfers Overall transfer level: Needs assistance Equipment used: Sliding board Transfers: Lateral/Scoot Transfers          Lateral/Scoot Transfers: Mod assist;+2 physical assistance General transfer comment: deferred standing  trials due to O2 sats.  Ambulation/Gait                 Stairs             Wheelchair Mobility    Modified Rankin (Stroke Patients Only)       Balance Overall balance assessment: Needs assistance Sitting-balance support: Feet supported Sitting balance-Leahy Scale: Fair Sitting balance - Comments: Patient able to maintain balance with A/P challenge but falters with weight shifts requiring UE support.                                    Cognition Arousal/Alertness: Awake/alert Behavior During Therapy: WFL for tasks assessed/performed Overall Cognitive Status: Within Functional Limits for tasks assessed                                        Exercises Other Exercises Other Exercises: R LE quad sets, SLR, ab/add and knee flex/ext in supine    General Comments        Pertinent Vitals/Pain Pain Assessment: Faces Faces Pain Scale: Hurts a little bit Pain Location: residual limb Pain Descriptors / Indicators: Aching Pain Intervention(s): Limited activity within patient's tolerance;Monitored during session;Repositioned    Home Living  Prior Function            PT Goals (current goals can now be found in the care plan section) Progress towards PT goals: Progressing toward goals    Frequency    7X/week      PT Plan Current plan remains appropriate    Co-evaluation              AM-PAC PT "6 Clicks" Mobility   Outcome Measure  Help needed turning from your back to your side while in a flat bed without using bedrails?: None Help needed moving from lying on your back to sitting on the side of a flat bed without using bedrails?: A Lot Help needed moving to and from a bed to a chair (including a wheelchair)?: Total Help needed standing up from a chair using your arms (e.g., wheelchair or bedside chair)?: Total Help needed to walk in hospital room?: Total Help needed climbing 3-5 steps  with a railing? : Total 6 Click Score: 10    End of Session Equipment Utilized During Treatment: Gait belt;Oxygen Activity Tolerance: Other (comment) Patient left: in chair;with call bell/phone within reach;with chair alarm set Nurse Communication: Other (comment);Mobility status       Time: 1010-1033 PT Time Calculation (min) (ACUTE ONLY): 23 min  Charges:  $Therapeutic Exercise: 8-22 mins $Therapeutic Activity: 8-22 mins                     Danielle Dess, PTA 11/05/18, 11:08 AM

## 2018-11-05 NOTE — Progress Notes (Signed)
Physical Therapy  informed RN  pt sats low on 2L [place] 89-90%. While working with Physical Therapy pt requiring more o2 increased to [facility] pt sats 90%. Paged Dr, Juliene Pina and made aware. New orders received. Pt's o2 sats increased when pt sat in chair. MD made aware. OK not to give lasix and potassium per MD. Will continue to monitor closely.

## 2018-11-05 NOTE — Progress Notes (Signed)
Per Dr. Juliene Pina ok to leave out IV and ok for no IV access. Multiple attempts have been made per pt report. Pt refusing to be stuck again.

## 2018-11-05 NOTE — Progress Notes (Signed)
[facility] - [place] at [facility]   PATIENT NAME: Douglas Jarvis    MR#:  [id number]  DATE OF BIRTH:  1955/08/26  SUBJECTIVE:   Doing ok this am no issues  REVIEW OF SYSTEMS:    Review of Systems  Constitutional: Negative.  Negative for chills, fever and malaise/fatigue.  HENT: Negative.  Negative for ear discharge, ear pain, hearing loss, nosebleeds and sore throat.   Eyes: Negative.  Negative for blurred vision and pain.  Respiratory: Negative.  Negative for cough, hemoptysis, shortness of breath and wheezing.   Cardiovascular: Negative.  Negative for chest pain, palpitations and leg swelling.  Gastrointestinal: Negative.  Negative for abdominal pain, blood in stool, diarrhea, nausea and vomiting.  Genitourinary: Negative.  Negative for dysuria.  Musculoskeletal: Negative for back pain.          Skin: Negative.   Neurological: Negative for dizziness, tremors, speech change, focal weakness, seizures and headaches.  Endo/Heme/Allergies: Negative.  Does not bruise/bleed easily.  Psychiatric/Behavioral: Negative.  Negative for depression, hallucinations and suicidal ideas.      Tolerating Diet: yes     DRUG ALLERGIES:   Allergies  Allergen Reactions  . Penicillins Shortness Of Breath  . Sulfa Antibiotics Other (See Comments)    Unsure of reaction as "it has been so long"    VITALS:  Blood pressure 108/76, pulse 79, temperature 97.8 F (36.6 C), temperature source Oral, resp. rate 20, height 5\' 10"  (1.778 m), weight 78.9 kg, SpO2 93 %.  PHYSICAL EXAMINATION:  Constitutional: Appears moderate distress with dyspnea and agitation. HENT: Normocephalic. Marland Kitchen Nonrebreather  eyes: Conjunctivae and EOM are normal. PERRLA, no scleral icterus.  Neck: Normal ROM. Neck supple. No JVD. No tracheal deviation. CVS: RRR, S1/S2 +, no murmurs, no gallops, no carotid bruit.  Pulmonary: Effort and breath sounds normal, no stridor, rhonchi, wheezes, rales.  Abdominal:  Soft. BS +,  no distension, tenderness, rebound or guarding.  Musculoskeletal: Right BKA Neuro: Alert.  Skin: Right BKA bandaged/covered   psychiatric: Agitated     LABORATORY PANEL:   CBC Recent Labs  Lab 11/04/18 0506  WBC 9.8  HGB 10.3*  HCT 33.5*  PLT 302   ------------------------------------------------------------------------------------------------------------------  Chemistries  Recent Labs  Lab 10/30/18 2046  11/04/18 0506 11/05/18 0514  NA 134*   < > 138 138  K 2.8*   < > 4.1 3.6  CL 100   < > 112* 110  CO2 22   < > 21* 21*  GLUCOSE 113*   < > 90 96  BUN 14   < > 40* 31*  CREATININE 0.65   < > 1.05 0.82  CALCIUM 7.3*   < > 7.7* 7.6*  MG  --    < > 2.1  --   AST 81*  --   --   --   ALT 41  --   --   --   ALKPHOS 97  --   --   --   BILITOT 1.2  --   --   --    < > = values in this interval not displayed.   ------------------------------------------------------------------------------------------------------------------  Cardiac Enzymes No results for input(s): TROPONINI in the last 168 hours. ------------------------------------------------------------------------------------------------------------------  RADIOLOGY:  No results found.   ASSESSMENT AND PLAN:   63 year old male with tobacco dependence, chronic diastolic heart failure and peripheral arterial disease presented to the hospital due to right foot gangrene.  1.  PAD with ischemic right lower extremity:   Revascularization was  attempted however this was unsuccessful.  He is not a candidate for bypass due to extent of disease.  He is postoperative day #3 right lower extremity BKA. Continue Plavix Plan for SNF at discharge.   Xeroform and kerlex wrap PRN soiled Vascular surgery consultation appreciated.   2.  Acute hypoxic respiratory failure in the setting of progressive interstitial lung disease/pulmonary edema with acute on chronic diastolic heart failure: Wean oxygen to room air  as tolerated 1 dose of IV Lasix today and reassess in a.m. Repeat chest x-ray for tomorrow morning Most recent chest x-ray shows improvement in aeration.   3.  Suicidal attempt with overdose of Coreg prior to admission:  IVC discontinued as well as sitter by psychiatry  continue Zoloft Psychiatry consult appreciated.   4.  Asymptomatic V. tach: No further episodes May discontinue telemetry today  Management plans discussed with the patient  CODE STATUS: DNR  TOTAL TIME TAKING CARE OF THIS PATIENT: 24 minutes.     POSSIBLE D/C Monday to skilled nursing facility depending on insurance approval and PASSR  Adrian SaranSital Ardelle Haliburton M.D on 11/05/2018 at 11:00 AM  Between 7am to 6pm - Pager - [phone] After 6pm go to [email or web address] - password [email or web address]  [facility]  Office  [phone]  CC: Primary care physician; [name], [name], MD  Note: This dictation was prepared with Dragon dictation along with smaller phrase technology. Any transcriptional errors that result from this process are unintentional.

## 2018-11-05 NOTE — Progress Notes (Signed)
OT Cancellation Note  Patient Details Name: Douglas Jarvis MRN: [id number] DOB: August 07, 1955   Cancelled Treatment:    Reason Eval/Treat Not Completed: Patient declined, no reason specified  Olegario Messier, MS, OTR/L 11/05/2018, 12:52 PM

## 2018-11-06 ENCOUNTER — Inpatient Hospital Stay: Payer: 59

## 2018-11-06 LAB — BASIC METABOLIC PANEL
Anion gap: 6 (ref 5–15)
BUN: 19 mg/dL (ref 8–23)
CO2: 21 mmol/L — ABNORMAL LOW (ref 22–32)
Calcium: 7.4 mg/dL — ABNORMAL LOW (ref 8.9–10.3)
Chloride: 108 mmol/L (ref 98–111)
Creatinine, Ser: 0.71 mg/dL (ref 0.61–1.24)
GFR calc Af Amer: >60 mL/min (ref >60)
GFR calc non Af Amer: >60 mL/min (ref >60)
Glucose, Bld: 96 mg/dL (ref 70–99)
Potassium: 3.4 mmol/L — ABNORMAL LOW (ref 3.5–5.1)
Sodium: 135 mmol/L (ref 135–145)

## 2018-11-06 MED ORDER — POTASSIUM CHLORIDE 20 MEQ/15ML (10%) PO SOLN
40.0000 meq | Freq: Every day | ORAL | Status: DC
  Administered 2018-11-06 – 2018-11-09 (×4): 40 meq via ORAL
  Filled 2018-11-06 (×5): qty 30

## 2018-11-06 MED ORDER — FUROSEMIDE 40 MG PO TABS
60.0000 mg | ORAL_TABLET | Freq: Every day | ORAL | Status: DC
  Administered 2018-11-06: 60 mg via ORAL
  Filled 2018-11-06 (×2): qty 1

## 2018-11-06 NOTE — Progress Notes (Signed)
4 Days Post-Op   Subjective/Chief Complaint: Mild pain in stump. Worked with PT yesterday. Otherwise without complaint.   Objective: Vital signs in last 24 hours: Temp:  [97.6 F (36.4 C)-98.6 F (37 C)] 98.6 F (37 C) (04/19 0600) Pulse Rate:  [81-92] 92 (04/19 0600) Resp:  [16-20] 16 (04/19 0600) BP: (108-110)/(62-68) 110/62 (04/19 0600) SpO2:  [90 %-93 %] 93 % (04/19 0600) Last BM Date: 11/05/18  Intake/Output from previous day: 04/18 0701 - 04/19 0700 In: 240 [P.O.:240] Out: 300 [Urine:300] Intake/Output this shift: No intake/output data recorded.  General appearance: alert and no distress Cardio: regular rate and rhythm Extremities: BKA- stump C/D/I, soft, viable  Lab Results:  Recent Labs    11/04/18 0506  WBC 9.8  HGB 10.3*  HCT 33.5*  PLT 302   BMET Recent Labs    11/05/18 0514 11/06/18 0453  NA 138 135  K 3.6 3.4*  CL 110 108  CO2 21* 21*  GLUCOSE 96 96  BUN 31* 19  CREATININE 0.82 0.71  CALCIUM 7.6* 7.4*   PT/INR No results for input(s): LABPROT, INR in the last 72 hours. ABG No results for input(s): PHART, HCO3 in the last 72 hours.  Invalid input(s): PCO2, PO2  Studies/Results: Dg Chest 1 View  Result Date: 11/06/2018 CLINICAL DATA:  chf 63 y/o male pt w/ CHF and HTN. Current smoker. Sx Peripheral vascular intervention 10/31/2018. EXAM: CHEST  1 VIEW COMPARISON:  11/03/2018 FINDINGS: Stable cardiac silhouette. Diffuse interstitial and airspace disease unchanged from comparison exam. No focal consolidation. No pneumothorax. IMPRESSION: No change in diffuse interstitial and airspace opacities. Electronically Signed   By: Genevive BiStewart  Edmunds M.D.   On: 11/06/2018 06:24    Anti-infectives: Anti-infectives (From admission, onward)   Start     Dose/Rate Route Frequency Ordered Stop   11/01/18 1045  vancomycin (VANCOCIN) IVPB 1000 mg/200 mL premix  Status:  Discontinued     1,000 mg 200 mL/hr over 60 Minutes Intravenous Every 12 hours 11/01/18  1030 11/03/18 1059   11/01/18 1000  vancomycin (VANCOCIN) 1,000 mg in sodium chloride 0.9 % 250 mL IVPB  Status:  Discontinued     1,000 mg 250 mL/hr over 60 Minutes Intravenous Every 12 hours 11/01/18 0900 11/01/18 1030   10/31/18 1000  vancomycin (VANCOCIN) 1,250 mg in sodium chloride 0.9 % 250 mL IVPB  Status:  Discontinued     1,250 mg 166.7 mL/hr over 90 Minutes Intravenous Every 12 hours 10/31/18 0454 11/01/18 0900   10/31/18 0600  ceFEPIme (MAXIPIME) 2 g in sodium chloride 0.9 % 100 mL IVPB  Status:  Discontinued     2 g 200 mL/hr over 30 Minutes Intravenous Every 8 hours 10/31/18 0454 11/03/18 1059   10/31/18 0600  metroNIDAZOLE (FLAGYL) IVPB 500 mg  Status:  Discontinued     500 mg 100 mL/hr over 60 Minutes Intravenous Every 8 hours 10/31/18 0506 11/03/18 1059   10/30/18 2200  vancomycin (VANCOCIN) 2,000 mg in sodium chloride 0.9 % 500 mL IVPB     2,000 mg 250 mL/hr over 120 Minutes Intravenous  Once 10/30/18 2153 10/31/18 0011   10/30/18 2100  ceFEPIme (MAXIPIME) 2 g in sodium chloride 0.9 % 100 mL IVPB     2 g 200 mL/hr over 30 Minutes Intravenous  Once 10/30/18 2057 10/30/18 2201   10/30/18 2100  metroNIDAZOLE (FLAGYL) IVPB 500 mg     500 mg 100 mL/hr over 60 Minutes Intravenous  Once 10/30/18 2057 10/30/18 2313   10/30/18  2100  vancomycin (VANCOCIN) IVPB 1000 mg/200 mL premix  Status:  Discontinued     1,000 mg 200 mL/hr over 60 Minutes Intravenous  Once 10/30/18 2057 10/30/18 2153      Assessment/Plan: s/p Procedure(s): AMPUTATION BELOW KNEE (Right) Continue OOB with PT  Daily dressing changes. Awaiting placement  LOS: 7 days    Bertram Denver 11/06/2018

## 2018-11-06 NOTE — Progress Notes (Signed)
PT Cancellation Note  Patient Details Name: Douglas Jarvis MRN: [id number] DOB: 12-Feb-1956   Cancelled Treatment:    Reason Eval/Treat Not Completed: Medical issues which prohibited therapy;Fatigue/lethargy limiting ability to participate   Pt in bed, appears fatigued.  Requesting to hold therapy this am due to feeling "wiped out."  Sats 81-82% on 2 lpm at rest.  Did not improve with encouragement for deep breathing.  Discussed with primary RN and to check.  Danielle Dess 11/06/2018, 9:56 AM

## 2018-11-06 NOTE — Progress Notes (Signed)
[facility] - [place] at [facility]   PATIENT NAME: Douglas PrinceWilliam Jarvis    MR#:  [id number]  DATE OF BIRTH:  09/24/1955  SUBJECTIVE:   Dressing change this morning by vascular surgery.  Shortness of breath slowly improving.  Denies chest pain or wheezing  REVIEW OF SYSTEMS:    Review of Systems  Constitutional: Negative.  Negative for chills, fever and malaise/fatigue.  HENT: Negative.  Negative for ear discharge, ear pain, hearing loss, nosebleeds and sore throat.   Eyes: Negative.  Negative for blurred vision and pain.  Respiratory: Negative.  Negative for cough, hemoptysis, shortness of breath and wheezing.   Cardiovascular: Negative.  Negative for chest pain, palpitations and leg swelling.  Gastrointestinal: Negative.  Negative for abdominal pain, blood in stool, diarrhea, nausea and vomiting.  Genitourinary: Negative.  Negative for dysuria.  Musculoskeletal: Negative for back pain.          Skin: Negative.   Neurological: Negative for dizziness, tremors, speech change, focal weakness, seizures and headaches.  Endo/Heme/Allergies: Negative.  Does not bruise/bleed easily.  Psychiatric/Behavioral: Negative.  Negative for depression, hallucinations and suicidal ideas.      Tolerating Diet: yes     DRUG ALLERGIES:   Allergies  Allergen Reactions  . Penicillins Shortness Of Breath  . Sulfa Antibiotics Other (See Comments)    Unsure of reaction as "it has been so long"    VITALS:  Blood pressure 110/62, pulse 92, temperature 98.6 F (37 C), temperature source Oral, resp. rate 16, height 5\' 10"  (1.778 m), weight 78.9 kg, SpO2 93 %.  PHYSICAL EXAMINATION:  Constitutional: Appears moderate distress with dyspnea and agitation. HENT: Normocephalic. Marland Kitchen. Nonrebreather  eyes: Conjunctivae and EOM are normal. PERRLA, no scleral icterus.  Neck: Normal ROM. Neck supple. No JVD. No tracheal deviation. CVS: RRR, S1/S2 +, no murmurs, no gallops, no carotid bruit.   Pulmonary: No respiratory effort with rhonchi bilaterally.    Abdominal: Soft. BS +,  no distension, tenderness, rebound or guarding.  Musculoskeletal: Right BKA Neuro: Alert.  Skin: Right BKA bandaged/covered   psychiatric: Pleasant    LABORATORY PANEL:   CBC Recent Labs  Lab 11/04/18 0506  WBC 9.8  HGB 10.3*  HCT 33.5*  PLT 302   ------------------------------------------------------------------------------------------------------------------  Chemistries  Recent Labs  Lab 10/30/18 2046  11/04/18 0506  11/06/18 0453  NA 134*   < > 138   < > 135  K 2.8*   < > 4.1   < > 3.4*  CL 100   < > 112*   < > 108  CO2 22   < > 21*   < > 21*  GLUCOSE 113*   < > 90   < > 96  BUN 14   < > 40*   < > 19  CREATININE 0.65   < > 1.05   < > 0.71  CALCIUM 7.3*   < > 7.7*   < > 7.4*  MG  --    < > 2.1  --   --   AST 81*  --   --   --   --   ALT 41  --   --   --   --   ALKPHOS 97  --   --   --   --   BILITOT 1.2  --   --   --   --    < > = values in this interval not displayed.   ------------------------------------------------------------------------------------------------------------------  Cardiac Enzymes No  results for input(s): TROPONINI in the last 168 hours. ------------------------------------------------------------------------------------------------------------------  RADIOLOGY:  Dg Chest 1 View  Result Date: 11/06/2018 CLINICAL DATA:  chf 63 y/o male pt w/ CHF and HTN. Current smoker. Sx Peripheral vascular intervention 10/31/2018. EXAM: CHEST  1 VIEW COMPARISON:  11/03/2018 FINDINGS: Stable cardiac silhouette. Diffuse interstitial and airspace disease unchanged from comparison exam. No focal consolidation. No pneumothorax. IMPRESSION: No change in diffuse interstitial and airspace opacities. Electronically Signed   By: Genevive Bi M.D.   On: 11/06/2018 06:24     ASSESSMENT AND PLAN:   63 year old male with tobacco dependence, chronic diastolic heart failure  and peripheral arterial disease presented to the hospital due to right foot gangrene.  1.  PAD with ischemic right lower extremity:   Revascularization was attempted however this was unsuccessful.  He is not a candidate for bypass due to extent of disease.  He is postoperative day #4 right lower extremity BKA. Continue Plavix Plan for SNF at discharge MONDAY.   Xeroform and kerlex wrap daily dressing changes. Vascular surgery consultation appreciated.   2.  Acute hypoxic respiratory failure in the setting of progressive interstitial lung disease/pulmonary edema with acute on chronic diastolic heart failure: Wean oxygen to room air as tolerated Continue Lasix 60 mg daily with potassium daily and continue to monitor intake and output with daily weight. Continue to monitor BMP He is -2 L would aim for 2-7-3L negative.   3.  Suicidal attempt with overdose of Coreg prior to admission:  IVC discontinued as well as sitter by psychiatry  continue Zoloft Psychiatry consult appreciated. He DOES NOT NEED IM Psych admission   4.  Asymptomatic V. tach: No further episodes Discontinued telemetry    Management plans discussed with the patient  CODE STATUS: DNR  TOTAL TIME TAKING CARE OF THIS PATIENT: 24 minutes.     POSSIBLE D/C Monday to skilled nursing facility depending on insurance approval and PASSR  Adrian Saran M.D on 11/06/2018 at 10:16 AM  Between 7am to 6pm - Pager - [phone] After 6pm go to [email or web address] - password [employer]  [facility]  Office  [phone]  CC: Primary care physician; [name], MD  Note: This dictation was prepared with Dragon dictation along with smaller phrase technology. Any transcriptional errors that result from this process are unintentional.

## 2018-11-07 LAB — BASIC METABOLIC PANEL
Anion gap: 11 (ref 5–15)
BUN: 15 mg/dL (ref 8–23)
CO2: 23 mmol/L (ref 22–32)
Calcium: 7.4 mg/dL — ABNORMAL LOW (ref 8.9–10.3)
Chloride: 104 mmol/L (ref 98–111)
Creatinine, Ser: 0.78 mg/dL (ref 0.61–1.24)
GFR calc Af Amer: >60 mL/min (ref >60)
GFR calc non Af Amer: >60 mL/min (ref >60)
Glucose, Bld: 99 mg/dL (ref 70–99)
Potassium: 3.1 mmol/L — ABNORMAL LOW (ref 3.5–5.1)
Sodium: 138 mmol/L (ref 135–145)

## 2018-11-07 LAB — CBC
HCT: 31.1 % — ABNORMAL LOW (ref 39.0–52.0)
Hemoglobin: 9.9 g/dL — ABNORMAL LOW (ref 13.0–17.0)
MCH: 27.6 pg (ref 26.0–34.0)
MCHC: 31.8 g/dL (ref 30.0–36.0)
MCV: 86.6 fL (ref 80.0–100.0)
Platelets: 386 10*3/uL (ref 150–400)
RBC: 3.59 MIL/uL — ABNORMAL LOW (ref 4.22–5.81)
RDW: 15.7 % — ABNORMAL HIGH (ref 11.5–15.5)
WBC: 8.5 10*3/uL (ref 4.0–10.5)
nRBC: 0 % (ref 0.0–0.2)

## 2018-11-07 MED ORDER — FUROSEMIDE 40 MG PO TABS: 40.0000 mg | ORAL_TABLET | Freq: Every day | ORAL | Status: DC

## 2018-11-07 MED ORDER — FUROSEMIDE 40 MG PO TABS
40.0000 mg | ORAL_TABLET | Freq: Every day | ORAL | Status: DC
  Administered 2018-11-07 – 2018-11-09 (×3): 40 mg via ORAL
  Filled 2018-11-07 (×3): qty 1

## 2018-11-07 MED ORDER — POTASSIUM CHLORIDE CRYS ER 20 MEQ PO TBCR
40.0000 meq | EXTENDED_RELEASE_TABLET | Freq: Once | ORAL | Status: AC
  Administered 2018-11-07: 40 meq via ORAL
  Filled 2018-11-07: qty 2

## 2018-11-07 NOTE — Progress Notes (Signed)
Attempted to wean patient from O2 per MD. Patient continued to desat when O2 was titrated down. Alerted RT and MD. Per MD keep O2 sats >90%. RT placed pt on high flow at 6L. Pt is tolerating fine.   Madie Reno, RN

## 2018-11-07 NOTE — Progress Notes (Signed)
PT Cancellation Note  Patient Details Name: Douglas Jarvis MRN: [id number] DOB: 04-18-1956   Cancelled Treatment:    Reason Eval/Treat Not Completed: Other (comment)(Stopped by patient's room as discussed with pt in AM to issue a paper copy of HEP. ) Verbally reviewed HEP with patient using handout as a visual. Of note pt on 3L/min upon entry SpO2: 84%, bumped to 4L/min, pt at 87% SpO2 at exit. RN notified of O2 findings and flow rate change.   4:38 PM, 11/07/18 Rosamaria Lints, PT, DPT Physical Therapist - [facility]  [phone] (ASCOM)       Mel Langan C 11/07/2018, 4:36 PM

## 2018-11-07 NOTE — Progress Notes (Signed)
[employer] [facility] [place] at [facility]   PATIENT NAME: Douglas Jarvis    MR#:  [id number]  DATE OF BIRTH:  [birth year]  SUBJECTIVE:   Dressing change done on 11/06/2018 by vascular surgery.  Shortness of breath slowly improving still on 3-1/2 L of oxygen via nasal cannula could not wean down.  Not on home O2.   denies chest pain or wheezing  REVIEW OF SYSTEMS:    Review of Systems  Constitutional: Negative.  Negative for chills, fever and malaise/fatigue.  HENT: Negative.  Negative for ear discharge, ear pain, hearing loss, nosebleeds and sore throat.   Eyes: Negative.  Negative for blurred vision and pain.  Respiratory: Negative.  Negative for cough, hemoptysis, shortness of breath and wheezing.   Cardiovascular: Negative.  Negative for chest pain, palpitations and leg swelling.  Gastrointestinal: Negative.  Negative for abdominal pain, blood in stool, diarrhea, nausea and vomiting.  Genitourinary: Negative.  Negative for dysuria.  Musculoskeletal: Negative for back pain.          Skin: Negative.   Neurological: Negative for dizziness, tremors, speech change, focal weakness, seizures and headaches.  Endo/Heme/Allergies: Negative.  Does not bruise/bleed easily.  Psychiatric/Behavioral: Negative.  Negative for depression, hallucinations and suicidal ideas.      Tolerating Diet: yes     DRUG ALLERGIES:   Allergies  Allergen Reactions  . Penicillins Shortness Of Breath  . Sulfa Antibiotics Other (See Comments)    Unsure of reaction as "it has been so long"    VITALS:  Blood pressure (!) 103/59, pulse 93, temperature 98.6 F (37 C), temperature source Oral, resp. rate 18, height 5\' 10"  (1.778 m), weight 78.9 kg, SpO2 91 %.  PHYSICAL EXAMINATION:  Constitutional: Appears moderate distress with dyspnea and agitation. HENT: Normocephalic. Marland Kitchen Nonrebreather  eyes: Conjunctivae and EOM are normal. PERRLA, no scleral icterus.  Neck: Normal ROM. Neck supple. No JVD.  No tracheal deviation. CVS: RRR, S1/S2 +, no murmurs, no gallops, no carotid bruit.  Pulmonary: No respiratory effort with rhonchi bilaterally.    Abdominal: Soft. BS +,  no distension, tenderness, rebound or guarding.  Musculoskeletal: Right BKA Neuro: Alert.  Skin: Right BKA bandaged/covered   psychiatric: Pleasant    LABORATORY PANEL:   CBC Recent Labs  Lab 11/07/18 0402  WBC 8.5  HGB 9.9*  HCT 31.1*  PLT 386   ------------------------------------------------------------------------------------------------------------------  Chemistries  Recent Labs  Lab 11/04/18 0506  11/07/18 0402  NA 138   < > 138  K 4.1   < > 3.1*  CL 112*   < > 104  CO2 21*   < > 23  GLUCOSE 90   < > 99  BUN 40*   < > 15  CREATININE 1.05   < > 0.78  CALCIUM 7.7*   < > 7.4*  MG 2.1  --   --    < > = values in this interval not displayed.   ------------------------------------------------------------------------------------------------------------------  Cardiac Enzymes No results for input(s): TROPONINI in the last 168 hours. ------------------------------------------------------------------------------------------------------------------  RADIOLOGY:  Dg Chest 1 View  Result Date: 11/06/2018 CLINICAL DATA:  chf 63 y/o male pt w/ CHF and HTN. Current smoker. Sx Peripheral vascular intervention 10/31/2018. EXAM: CHEST  1 VIEW COMPARISON:  11/03/2018 FINDINGS: Stable cardiac silhouette. Diffuse interstitial and airspace disease unchanged from comparison exam. No focal consolidation. No pneumothorax. IMPRESSION: No change in diffuse interstitial and airspace opacities. Electronically Signed   By: Genevive Bi M.D.   On: 11/06/2018 06:24  ASSESSMENT AND PLAN:   63 year old male with tobacco dependence, chronic diastolic heart failure and peripheral arterial disease presented to the hospital due to right foot gangrene.  1.  PAD with ischemic right lower extremity:    Revascularization was attempted however this was unsuccessful.  He is not a candidate for bypass due to extent of disease.  He is postoperative day #4 right lower extremity BKA. Continue Plavix Plan for SNF at discharge after insurance approval.  [name] approved Xeroform and kerlex wrap daily dressing changes. Vascular surgery consultation appreciated.   2.  Acute hypoxic respiratory failure in the setting of progressive interstitial lung disease/pulmonary edema with acute on chronic diastolic heart failure: Wean oxygen to room air as tolerated Continue Lasix 40 mg daily with potassium daily and continue to monitor intake and output with daily weight. Continue to monitor BMP He is still 3 and half liters of oxygen via nasal cannula.  Patient does not use oxygen at home we will wean him down as tolerated   3.  Suicidal attempt with overdose of Coreg prior to admission:  IVC discontinued as well as sitter by psychiatry  continue Zoloft Psychiatry consult appreciated. He DOES NOT NEED IM Psych admission   4.  Asymptomatic V. tach: No further episodes Discontinued telemetry    Management plans discussed with the patient  CODE STATUS: DNR  TOTAL TIME TAKING CARE OF THIS PATIENT: 29 minutes.     POSSIBLE D/C Monday to skilled nursing facility depending on insurance approval and [name]  [name] on 11/07/2018 at 12:30 PM  Between 7am to 6pm - Pager - [phone] After 6pm go to [email or web address] - password [email or web address]  [facility]  Office  [phone]  CC: Primary care physician; [name], [name], MD  Note: This dictation was prepared with Dragon dictation along with smaller phrase technology. Any transcriptional errors that result from this process are unintentional.

## 2018-11-07 NOTE — TOC Progression Note (Signed)
Transition of Care [facility]) - Progression Note    Patient Details  Name: [name] MRN: [id number] Date of Birth: [birth year]  Transition of Care [facility]) CM/SW Contact  York Spaniel, [place] Phone Number: 11/07/2018, 3:42 PM  Clinical Narrative:   CSW contacted each nursing home that had made an offer for patient and only one was still able to offer a bed: [address] in [place]. CSW spoke with Reyne Dumas: [phone] and Tammy stated she could take patient tomorrow pending prior auth with [facility]. It is a cobra plan so it is uncertain how long it may take for approval. Patient is in aware and in agreement with transfer to [place] in [place]. Patient will more than likely transport via EMS. Tammy emailed her admission paperwork to this CSW and had patient sign the appropriate paperwork. CSW has faxed this signed paperwork back to Tammy and will need to send the original in the patient's packet tomorrow.     Expected Discharge Plan: Skilled Nursing Facility Barriers to Discharge: No SNF bed  Expected Discharge Plan and Services Expected Discharge Plan: Skilled Nursing Facility       Living arrangements for the past 2 months: Single Family Home                           Social Determinants of Health (SDOH) Interventions    Readmission Risk Interventions No flowsheet data found.

## 2018-11-07 NOTE — Progress Notes (Signed)
[facility]  Daily Progress Note   Subjective  - 5 Days Post-Op  Feeling well.  Waiting for placement. Pain controlled  Objective Vitals:   11/07/18 0437 11/07/18 1110 11/07/18 1314 11/07/18 1337  BP: (!) 103/59  102/69   Pulse: 93 83 86 84  Resp: 18  16   Temp: 98.6 F (37 C)     TempSrc: Oral     SpO2: 91% (!) 84% 91% 91%  Weight:      Height:        Intake/Output Summary (Last 24 hours) at 11/07/2018 1429 Last data filed at 11/07/2018 1347 Gross per 24 hour  Intake 240 ml  Output 1505 ml  Net -1265 ml    PULM  CTAB CV  RRR VASC  Stump is C/D/I  Laboratory CBC    Component Value Date/Time   WBC 8.5 11/07/2018 0402   HGB 9.9 (L) 11/07/2018 0402   HGB 13.1 01/31/2014 1335   HCT 31.1 (L) 11/07/2018 0402   HCT 39.2 (L) 01/31/2014 1335   PLT 386 11/07/2018 0402   PLT 391 01/31/2014 1335    BMET    Component Value Date/Time   NA 138 11/07/2018 0402   NA 135 (L) 01/31/2014 1335   K 3.1 (L) 11/07/2018 0402   K 3.5 01/31/2014 1335   CL 104 11/07/2018 0402   CL 101 01/31/2014 1335   CO2 23 11/07/2018 0402   CO2 24 01/31/2014 1335   GLUCOSE 99 11/07/2018 0402   GLUCOSE 106 (H) 01/31/2014 1335   BUN 15 11/07/2018 0402   BUN 14 01/31/2014 1335   CREATININE 0.78 11/07/2018 0402   CREATININE 0.81 01/31/2014 1335   CALCIUM 7.4 (L) 11/07/2018 0402   CALCIUM 7.8 (L) 01/31/2014 1335   GFRNONAA >60 11/07/2018 0402   GFRNONAA >60 01/31/2014 1335   GFRAA >60 11/07/2018 0402   GFRAA >60 01/31/2014 1335    Assessment/Planning: POD #5 s/p right BKA   RTC three weeks for staple removal and wound check  No other recs from vascular POV.  OK to discharge to rehab    Festus Barren  11/07/2018, 2:29 PM

## 2018-11-07 NOTE — Progress Notes (Addendum)
Physical Therapy Treatment Patient Details Name: Douglas Jarvis MRN: [id number] DOB: Nov 01, 1955 Today's Date: 11/07/2018    History of Present Illness Patient has known history of systolic congestive heart failure and hypertension and presented to ED and was found to have right foot gangrene and congestive heart failure acute on chronic systolic, as well as experiencing respiratory distress. 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 Patient on 4L O2 via [employer].    PT Comments    Pt in bed upon entry, report continued feelings of exhaustion: of note SpO2 at 84% upon entry. Pt asks to defer OOB and transfers training as he is feeling poorly, but ios agreeable to bed level exercises.Pt adjusted to 4L/min to maintain sats over 88% resting, but requires 5L + rest breaks + VC for nasal inhalation to maintain sats during exercises in bed. Pt tolerating knee flexion ROM just below 80 degrees with pain well controlled. RN in room at end of session, discussed desaturation concerns, pt on 5L and at 93% at end of session, RN asks flow rate be adjusted down to 4L/min.    Follow Up Recommendations  SNF     Equipment Recommendations       Recommendations for Other Services       Precautions / Restrictions Precautions Precautions: Fall Precaution Comments: s/p R BKA Restrictions Weight Bearing Restrictions: Yes RLE Weight Bearing: Non weight bearing Other Position/Activity Restrictions: s/p R BKA    Mobility  Bed Mobility Overal bed mobility: (pt politely declines, reports he continues to feel very exhausted (noted 84% O2 upon arrival) )                Transfers                    Ambulation/Gait                 Stairs             Wheelchair Mobility    Modified Rankin (Stroke Patients Only)       Balance                                            Cognition Arousal/Alertness:  Awake/alert Behavior During Therapy: WFL for tasks assessed/performed;Anxious Overall Cognitive Status: Within Functional Limits for tasks assessed                                        Exercises General Exercises - Lower Extremity Heel Raises: AROM;Left;Supine;10 reps;Limitations Heel Raises Limitations: Manual resistance during the extension phase.  Amputee Exercises Hip ABduction/ADduction: Strengthening;Right;15 reps;AAROM;Supine Knee Flexion: Both;PROM;Right;15 reps Knee Extension: AROM;Strengthening;Right;Supine;15 reps Straight Leg Raises: Strengthening;AROM;Right;15 reps;Supine    General Comments        Pertinent Vitals/Pain Pain Assessment: 0-10 Pain Score: 3  Pain Location: residual limb Pain Descriptors / Indicators: Aching Pain Intervention(s): Limited activity within patient's tolerance;Monitored during session;Premedicated before session;Repositioned    Home Living                      Prior Function            PT Goals (current goals can now be found in the care plan section) Acute Rehab PT Goals Patient  Stated Goal: increase QOL PT Goal Formulation: With patient Potential to Achieve Goals: Good Progress towards PT goals: PT to reassess next treatment    Frequency    7X/week      PT Plan Current plan remains appropriate    Co-evaluation              AM-PAC PT "6 Clicks" Mobility   Outcome Measure  Help needed turning from your back to your side while in a flat bed without using bedrails?: None Help needed moving from lying on your back to sitting on the side of a flat bed without using bedrails?: A Lot Help needed moving to and from a bed to a chair (including a wheelchair)?: Total Help needed standing up from a chair using your arms (e.g., wheelchair or bedside chair)?: Total Help needed to walk in hospital room?: Total Help needed climbing 3-5 steps with a railing? : Total 6 Click Score: 10    End of  Session Equipment Utilized During Treatment: Gait belt;Oxygen Activity Tolerance: Other (comment) Patient left: in bed;with nursing/sitter in room;with call bell/phone within reach Nurse Communication: Other (comment);Mobility status PT Visit Diagnosis: Unsteadiness on feet (R26.81);Muscle weakness (generalized) (M62.81);Difficulty in walking, not elsewhere classified (R26.2);Other abnormalities of gait and mobility (R26.89)     Time: 5809-9833 PT Time Calculation (min) (ACUTE ONLY): 26 min  Charges:  $Therapeutic Exercise: 23-37 mins                     12:51 PM, 11/07/18 Rosamaria Lints, PT, DPT Physical Therapist - [facility]  [phone] (ASCOM)     Buccola,Allan C 11/07/2018, 12:48 PM

## 2018-11-08 LAB — BASIC METABOLIC PANEL
Anion gap: 8 (ref 5–15)
BUN: 21 mg/dL (ref 8–23)
CO2: 25 mmol/L (ref 22–32)
Calcium: 7.5 mg/dL — ABNORMAL LOW (ref 8.9–10.3)
Chloride: 102 mmol/L (ref 98–111)
Creatinine, Ser: 0.83 mg/dL (ref 0.61–1.24)
GFR calc Af Amer: >60 mL/min (ref >60)
GFR calc non Af Amer: >60 mL/min (ref >60)
Glucose, Bld: 95 mg/dL (ref 70–99)
Potassium: 3.8 mmol/L (ref 3.5–5.1)
Sodium: 135 mmol/L (ref 135–145)

## 2018-11-08 LAB — SARS CORONAVIRUS 2 BY RT PCR (HOSPITAL ORDER, PERFORMED IN ~~LOC~~ HOSPITAL LAB): SARS Coronavirus 2: NEGATIVE

## 2018-11-08 MED ORDER — TRAMADOL HCL 50 MG PO TABS: 50.0000 mg | ORAL_TABLET | Freq: Four times a day (QID) | ORAL | 0 refills | Status: AC | PRN

## 2018-11-08 MED ORDER — POTASSIUM CHLORIDE CRYS ER 20 MEQ PO TBCR
40.0000 meq | EXTENDED_RELEASE_TABLET | Freq: Every day | ORAL | Status: DC
  Administered 2018-11-08 – 2018-11-09 (×2): 40 meq via ORAL
  Filled 2018-11-08: qty 2

## 2018-11-08 MED ORDER — ASCORBIC ACID 250 MG PO TABS: 250.0000 mg | ORAL_TABLET | Freq: Two times a day (BID) | ORAL | Status: AC

## 2018-11-08 MED ORDER — ONDANSETRON HCL 4 MG PO TABS: 4.0000 mg | ORAL_TABLET | Freq: Four times a day (QID) | ORAL | 0 refills | Status: AC | PRN

## 2018-11-08 MED ORDER — ACETAMINOPHEN 325 MG PO TABS: 650.0000 mg | ORAL_TABLET | Freq: Four times a day (QID) | ORAL | Status: AC | PRN

## 2018-11-08 MED ORDER — OCUVITE-LUTEIN PO CAPS: 1.0000 | ORAL_CAPSULE | Freq: Every day | ORAL | 0 refills | Status: AC

## 2018-11-08 MED ORDER — ASPIRIN 81 MG PO TBEC: 81.0000 mg | DELAYED_RELEASE_TABLET | Freq: Every day | ORAL | Status: AC

## 2018-11-08 MED ORDER — ENSURE MAX PROTEIN PO LIQD: 11.0000 [oz_av] | Freq: Two times a day (BID) | ORAL | 1 refills | Status: AC

## 2018-11-08 MED ORDER — IPRATROPIUM-ALBUTEROL 0.5-2.5 (3) MG/3ML IN SOLN: 3.0000 mL | Freq: Two times a day (BID) | RESPIRATORY_TRACT | 0 refills | Status: AC

## 2018-11-08 MED ORDER — POTASSIUM CHLORIDE CRYS ER 20 MEQ PO TBCR: 40.0000 meq | EXTENDED_RELEASE_TABLET | Freq: Every day | ORAL | Status: AC

## 2018-11-08 MED ORDER — CLOPIDOGREL BISULFATE 75 MG PO TABS: 75.0000 mg | ORAL_TABLET | Freq: Every day | ORAL | Status: AC

## 2018-11-08 MED ORDER — FUROSEMIDE 40 MG PO TABS: 40.0000 mg | ORAL_TABLET | Freq: Every day | ORAL | 0 refills | Status: AC

## 2018-11-08 MED ORDER — NICOTINE 21 MG/24HR TD PT24: 21.0000 mg | MEDICATED_PATCH | Freq: Every day | TRANSDERMAL | 0 refills | Status: AC

## 2018-11-08 MED ORDER — SERTRALINE HCL 50 MG PO TABS: 50.0000 mg | ORAL_TABLET | Freq: Every day | ORAL | 0 refills | Status: AC

## 2018-11-08 MED ORDER — NICOTINE 21 MG/24HR TD PT24
21.0000 mg | MEDICATED_PATCH | Freq: Every day | TRANSDERMAL | Status: DC
  Administered 2018-11-08 – 2018-11-09 (×2): 21 mg via TRANSDERMAL
  Filled 2018-11-08 (×2): qty 1

## 2018-11-08 NOTE — Care Management (Signed)
Informed by primary nurse that [facility] will not accept report because patient has not been screened for covid 19.  CM unable to to reach Tammy who has assisted with admission coordination. The DON relayed that "[facility] screens all patient's" and it is possible it was assumed this patient had been screened. Attending has ordered a rapid covid test. If results are negative, patient's discharge will move forward.  The  transfer had already been delayed until after 2pm per facility request due to be availability.

## 2018-11-08 NOTE — Progress Notes (Signed)
Occupational Therapy Treatment Patient Details Name: Douglas Jarvis MRN: [id number] DOB: 30-Jan-1956 Today's Date: 11/08/2018    History of present illness 63 y/o with history of systolic congestive heart failure and hypertension and presented to ED and was found to have right foot gangrene and congestive heart failure acute on chronic systolic, as well as experiencing respiratory distress. 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 Patient on 4L O2 via [place].   OT comments  Pt seen for OT tx this date. Pt instructed further in ECS, falls prevention, pursed lip breathing, and residual limb mgt with pt able to recall >75% of the instruction previously provided, but unable to verbalize examples of how to implement into daily routines. Additional instruction provided to support incorporating strategies into pt's daily routines. Pt endorses slight SOB in bed at rest, O2 sats 88-89% on 6L. RN in room administering medications was notified and RN stated that she would report this to the patient's primary RN.   Follow Up Recommendations  SNF    Equipment Recommendations  3 in 1 bedside commode;Other (comment)(reacher, sock aid, LH sponge, LH shoe horn, elastic shoe laces for L shoe)    Recommendations for Other Services      Precautions / Restrictions Precautions Precautions: Fall Precaution Comments: s/p R BKA Restrictions Weight Bearing Restrictions: Yes RLE Weight Bearing: Non weight bearing Other Position/Activity Restrictions: s/p R BKA       Mobility Bed Mobility               General bed mobility comments: Pt able to roll to L side w/o assist for R hip flexor stretches, did not wish to participate with any mobility for EOB, OOB, etc  Transfers                      Balance                                           ADL either performed or assessed with clinical judgement   ADL                                                Vision Baseline Vision/History: Wears glasses Wears Glasses: At all times Patient Visual Report: No change from baseline     Perception     Praxis      Cognition Arousal/Alertness: Awake/alert Behavior During Therapy: WFL for tasks assessed/performed Overall Cognitive Status: Within Functional Limits for tasks assessed                                          Exercises Other Exercises Other Exercises: Pt instructed further in ECS, falls prevention, pursed lip breathing, and residual limb mgt with pt able to recall >75% of the instruction previously provided, but unable to verbalize examples of how to implement into daily routines. Additional instruction provided to support incorporating strategies into pt's daily routines    Shoulder Instructions       General Comments Pt endorses slight SOB in bed at rest, O2 sats 88-89% on 6L. RN in room notified who stated  she would report this to the patient's primary RN.    Pertinent Vitals/ Pain       Pain Assessment: 0-10 Pain Score: 2  Pain Location: residual R limb Pain Descriptors / Indicators: Aching Pain Intervention(s): Limited activity within patient's tolerance;Monitored during session  Home Living                                          Prior Functioning/Environment              Frequency  Min 2X/week        Progress Toward Goals  OT Goals(current goals can now be found in the care plan section)  Progress towards OT goals: Progressing toward goals  Acute Rehab OT Goals Patient Stated Goal: increase QOL Time For Goal Achievement: 11/17/18 Potential to Achieve Goals: Good  Plan Discharge plan remains appropriate;Frequency remains appropriate    Co-evaluation                 AM-PAC OT "6 Clicks" Daily Activity     Outcome Measure   Help from another person eating meals?: None Help from another person taking care of  personal grooming?: None Help from another person toileting, which includes using toliet, bedpan, or urinal?: A Lot Help from another person bathing (including washing, rinsing, drying)?: A Lot Help from another person to put on and taking off regular upper body clothing?: A Little Help from another person to put on and taking off regular lower body clothing?: A Lot 6 Click Score: 17    End of Session    OT Visit Diagnosis: Other abnormalities of gait and mobility (R26.89);History of falling (Z91.81);Muscle weakness (generalized) (M62.81);Pain Pain - Right/Left: Right Pain - part of body: Knee;Leg   Activity Tolerance Patient tolerated treatment well   Patient Left in bed;with call bell/phone within reach;with bed alarm set   Nurse Communication          Time: [id number] OT Time Calculation (min): 13 min  Charges: OT General Charges $OT Visit: 1 Visit OT Treatments $Therapeutic Activity: 8-22 mins  Richrd PrimeJamie Stiller, MPH, MS, OTR/L ascom [phone] 11/08/18, 2:55 PM

## 2018-11-08 NOTE — Progress Notes (Signed)
Physical Therapy Treatment Patient Details Name: Douglas Jarvis MRN: [id number] DOB: 01/05/1956 Today's Date: 11/08/2018    History of Present Illness 63 y/o with history of systolic congestive heart failure and hypertension and presented to ED and was found to have right foot gangrene and congestive heart failure acute on chronic systolic, as well as experiencing respiratory distress. 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 Patient on 4L O2 via .    PT Comments    Pt did relatively well with exercises and in-bed mobility. He reports that he did not get to doing his HEP much last night, but that he looked at them and was familiar with this and did not have questions in this regard.  He reports he wants to safe his energy for going to the facility this afternoon (so no mobility) but was willing to do some b/l LE exercises, still pain sensitive with most R LE acts, but with care for pressure amount and location he did well.   Sidelying R hip extension stretches to neutral with some discomfort, pt with improving R knee ROM/tolerance.  Follow Up Recommendations  SNF     Equipment Recommendations       Recommendations for Other Services       Precautions / Restrictions Precautions Precautions: Fall Restrictions RLE Weight Bearing: Non weight bearing    Mobility  Bed Mobility               General bed mobility comments: Pt able to roll to L side w/o assist for R hip flexor stretches, did not wish to participate with any mobility for EOB, OOB, etc  Transfers                    Ambulation/Gait                 Stairs             Wheelchair Mobility    Modified Rankin (Stroke Patients Only)       Balance                                            Cognition Arousal/Alertness: Awake/alert Behavior During Therapy: WFL for tasks assessed/performed;Anxious Overall Cognitive Status:  Within Functional Limits for tasks assessed                                        Exercises General Exercises - Lower Extremity Ankle Circles/Pumps: (PROM/stretching with limited ROM L ankle) Quad Sets: Strengthening;10 reps;Both Short Arc Quad: AROM;10 reps;Both(cues for full ROM on R) Heel Slides: Strengthening;10 reps;Left Hip ABduction/ADduction: Strengthening;10 reps;Both Straight Leg Raises: AROM;10 reps;Both Other Exercises Other Exercises: side lying R hip flexor streches 3 X 15 sec    General Comments        Pertinent Vitals/Pain Pain Assessment: 0-10 Pain Score: 4  Pain Location: residual R limb    Home Living                      Prior Function            PT Goals (current goals can now be found in the care plan section) Progress towards PT goals: Progressing toward goals  Frequency    7X/week      PT Plan Current plan remains appropriate    Co-evaluation              AM-PAC PT "6 Clicks" Mobility   Outcome Measure                   End of Session Equipment Utilized During Treatment: Gait belt;Oxygen Activity Tolerance: Patient limited by fatigue(Pt's O2 generally in the mid 80s even on supplemental O2) Patient left: with bed alarm set;with nursing/sitter in room   PT Visit Diagnosis: Unsteadiness on feet (R26.81);Muscle weakness (generalized) (M62.81);Difficulty in walking, not elsewhere classified (R26.2);Other abnormalities of gait and mobility (R26.89)     Time: 8676-7209 PT Time Calculation (min) (ACUTE ONLY): 25 min  Charges:  $Therapeutic Exercise: 23-37 mins                     Malachi Pro, DPT 11/08/2018, 1:47 PM

## 2018-11-08 NOTE — Care Management (Signed)
Douglas Jarvis csw spoke with admission coordinator Tammy.  It is documented 02 sats 88% on 6 liters at 14:47 at rest..  There is now concern that facility will have to obtain a 10 liter tank to accommodate an increased liter flow.

## 2018-11-08 NOTE — Progress Notes (Signed)
SNF is asking to do COVID -19 test prior to the discharge otherwise they are refusing to take the patient, ordered test , results will be available in 45 minutes. Patient will be discharged if test results are negative

## 2018-11-08 NOTE — Discharge Summary (Signed)
[facility] - [place] at [facility]   PATIENT NAME: Douglas Jarvis    MR#:  [id number]  DATE OF BIRTH:  05-Jan-1956  DATE OF ADMISSION:  10/30/2018 ADMITTING PHYSICIAN: Enedina Finner, MD  DATE OF DISCHARGE:  11/08/18  PRIMARY CARE PHYSICIAN: [name], MD    ADMISSION DIAGNOSIS:  Acute respiratory failure with hypoxia (HCC) [J96.01] Gangrene of right foot (HCC) [I96]  DISCHARGE DIAGNOSIS:  Active Problems:   Gangrene of right foot (HCC) Acute respiratory failure with hypoxia-requiring 4 L of oxygen via nasal cannula Tobacco abuse disorder  SECONDARY DIAGNOSIS:   Past Medical History:  Diagnosis Date  . CHF (congestive heart failure) (HCC)   . Hypertension   . Renal disorder     HOSPITAL COURSE:  hpi  Douglas Jarvis  is a 63 y.o. male with a known history of systolic mild congestive heart failure, hypertension comes to the emergency room when a family member notified his brother who came to check on patient found to have right foot gangrene/blackened foot. Patient was brought to the emergency room and was found to have right foot gangrene and congestive heart failure acute on chronic systolic. Patient has stopped taking his Coreg Lasix and spironolactone for last two weeks. He apparently had overdosed on his hold prescription of Coreg which patient reports because he is tired of his quality of life and lifestyle. He did not inform anyone of his overdosing. Patient currently is under IVC by ER physician. Psych consult has been placed.  X-ray of the right foot shows gangrene. Patient received IV vancomycin, Flagyl,cefepime ER.  His blood pressure was a bit on the softer side. Lasix 20 mg was started IV. Patient currently is on oxygen his sats are 94% nearby evaluation. He does not wear oxygen.  He is a heavy smoker. 1.  PAD with ischemic right lower extremity:  Revascularization was attempted however this was unsuccessful.  He is not a candidate for  bypass due to extent of disease.  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 Continue Plavix Plan for SNF at discharge  today [name] approved Xeroform and kerlex wrap daily dressing changes. Vascular surgery consultation appreciated.  Follow-up with Dr. Wyn Quaker in 3 weeks after discharge for staple removal and wound check   2.  Acute hypoxic respiratory failure in the setting of progressive interstitial lung disease/pulmonary edema with acute on chronic diastolic heart failure: Wean oxygen to room air as tolerated Continue Lasix 40 mg daily with potassium daily and continue to monitor intake and output with daily weight. Coreg and spironolactone on hold in view of low blood pressure Continue to monitor BMP He is still  On 4 liters of oxygen via nasal cannula.  Patient does not use oxygen at home we will wean him down as tolerated   3.  Suicidal attempt with overdose of Coreg prior to admission:  IVC discontinued as well as sitter by psychiatry  continue Zoloft Psychiatry consult appreciated. He DOES NOT NEED IM Psych admission   4.  Asymptomatic V. tach: No further episodes Discontinued telemetry    Management plans discussed with the patient  CODE STATUS: DNR  Disposition skilled nursing facility [facility]  DISCHARGE CONDITIONS:   STABLE  CONSULTS OBTAINED:  Treatment Team:  Bertram Denver, MD Lamar Blinks, MD Annice Needy, MD   PROCEDURES right BKA  DRUG ALLERGIES:   Allergies  Allergen Reactions  . Penicillins Shortness Of Breath  . Sulfa Antibiotics Other (  See Comments)    Unsure of reaction as "it has been so long"    DISCHARGE MEDICATIONS:   Allergies as of 11/08/2018      Reactions   Penicillins Shortness Of Breath   Sulfa Antibiotics Other (See Comments)   Unsure of reaction as "it has been so long"      Medication List    STOP taking these medications   carvedilol 12.5 MG tablet Commonly known as:   COREG   spironolactone 25 MG tablet Commonly known as:  ALDACTONE     TAKE these medications   acetaminophen 325 MG tablet Commonly known as:  TYLENOL Take 2 tablets (650 mg total) by mouth every 6 (six) hours as needed for mild pain (or Fever >/= 101).   ascorbic acid 250 MG tablet Commonly known as:  VITAMIN C Take 1 tablet (250 mg total) by mouth 2 (two) times daily.   aspirin 81 MG EC tablet Take 1 tablet (81 mg total) by mouth daily. Start taking on:  November 09, 2018   clopidogrel 75 MG tablet Commonly known as:  PLAVIX Take 1 tablet (75 mg total) by mouth daily. Start taking on:  November 09, 2018   Ensure Max Protein Liqd Take 330 mLs (11 oz total) by mouth 2 (two) times daily.   furosemide 40 MG tablet Commonly known as:  LASIX Take 1 tablet (40 mg total) by mouth daily. Start taking on:  November 09, 2018 What changed:    medication strength  how much to take   ipratropium-albuterol 0.5-2.5 (3) MG/3ML Soln Commonly known as:  DUONEB Take 3 mLs by nebulization 2 times daily at 12 noon and 4 pm.   multivitamin-lutein Caps capsule Take 1 capsule by mouth daily.   nicotine 21 mg/24hr patch Commonly known as:  NICODERM CQ - dosed in mg/24 hours Place 1 patch (21 mg total) onto the skin daily.   ondansetron 4 MG tablet Commonly known as:  ZOFRAN Take 1 tablet (4 mg total) by mouth every 6 (six) hours as needed for nausea.   potassium chloride SA 20 MEQ tablet Commonly known as:  K-DUR Take 2 tablets (40 mEq total) by mouth daily.   sertraline 50 MG tablet Commonly known as:  ZOLOFT Take 1 tablet (50 mg total) by mouth daily. Start taking on:  November 09, 2018   traMADol 50 MG tablet Commonly known as:  ULTRAM Take 1 tablet (50 mg total) by mouth every 6 (six) hours as needed for moderate pain or severe pain.        DISCHARGE INSTRUCTIONS:   Follow-up with vascular surgery Dr. Raliegh Scarlet weeks for staple removal and wound check Follow-up with primary  care physician at the facility in 3 days  DIET:  Cardiac diet  DISCHARGE CONDITION:  Fair  ACTIVITY:  Activity as tolerated per PT   OXYGEN:  Home Oxygen: Yes.     Oxygen Delivery: 4 liters/min via Patient connected to nasal cannula oxygen  DISCHARGE LOCATION:  nursing home   If you experience worsening of your admission symptoms, develop shortness of breath, life threatening emergency, suicidal or homicidal thoughts you must seek medical attention immediately by calling 911 or calling your MD immediately  if symptoms less severe.  You Must read complete instructions/literature along with all the possible adverse reactions/side effects for all the Medicines you take and that have been prescribed to you. Take any new Medicines after you have completely understood and accpet all the possible adverse reactions/side effects.  Please note  You were cared for by a hospitalist during your hospital stay. If you have any questions about your discharge medications or the care you received while you were in the hospital after you are discharged, you can call the unit and asked to speak with the hospitalist on call if the hospitalist that took care of you is not available. Once you are discharged, your primary care physician will handle any further medical issues. Please note that NO REFILLS for any discharge medications will be authorized once you are discharged, as it is imperative that you return to your primary care physician (or establish a relationship with a primary care physician if you do not have one) for your aftercare needs so that they can reassess your need for medications and monitor your lab values.     Today  Chief Complaint  Patient presents with  . Fever   Patient is feeling fine.  He was smoking 1 and half pack a day prior to the admission and currently requiring 4 L of oxygen Doing fine and agreeable to rehab  ROS:  CONSTITUTIONAL: Denies fevers, chills. Denies any  fatigue, weakness.  EYES: Denies blurry vision, double vision, eye pain. EARS, NOSE, THROAT: Denies tinnitus, ear pain, hearing loss. RESPIRATORY: Denies cough, wheeze, shortness of breath.  CARDIOVASCULAR: Denies chest pain, palpitations, edema.  GASTROINTESTINAL: Denies nausea, vomiting, diarrhea, abdominal pain. Denies bright red blood per rectum. GENITOURINARY: Denies dysuria, hematuria. ENDOCRINE: Denies nocturia or thyroid problems. HEMATOLOGIC AND LYMPHATIC: Denies easy bruising or bleeding. SKIN: Denies rash or lesion. MUSCULOSKELETAL: Denies pain in neck, back, shoulder, knees, hips or arthritic symptoms.  NEUROLOGIC: Denies paralysis, paresthesias.  PSYCHIATRIC: Denies anxiety or depressive symptoms.   VITAL SIGNS:  Blood pressure 109/71, pulse 89, temperature 98.5 F (36.9 C), temperature source Oral, resp. rate 18, height  (1.778 m), weight 78.9 kg, SpO2 93 %.  I/O:    Intake/Output Summary (Last 24 hours) at 11/08/2018 1045 Last data filed at 11/08/2018 0924 Gross per 24 hour  Intake 240 ml  Output 1550 ml  Net -1310 ml    PHYSICAL EXAMINATION:  GENERAL:  63 y.o.-year-old patient lying in the bed with no acute distress.  EYES: Pupils equal, round, reactive to light and accommodation. No scleral icterus. Extraocular muscles intact.  HEENT: Head atraumatic, normocephalic. Oropharynx and nasopharynx clear.  NECK:  Supple, no jugular venous distention. No thyroid enlargement, no tenderness.  LUNGS: Normal breath sounds bilaterally, no wheezing, rales,rhonchi or crepitation. No use of accessory muscles of respiration.  CARDIOVASCULAR: S1, S2 normal. No murmurs, rubs, or gallops.  ABDOMEN: Soft, non-tender, non-distended. Bowel sounds present. EXTREMITIES: Right BKA clean dressing no pedal edema, cyanosis, or clubbing.  NEUROLOGIC: Awake, alert and oriented x3 sensation intact. Gait not checked.  PSYCHIATRIC: The patient is alert and oriented x 3.  SKIN: No obvious  rash, lesion, or ulcer.   DATA REVIEW:   CBC Recent Labs  Lab 11/07/18 0402  WBC 8.5  HGB 9.9*  HCT 31.1*  PLT 386    Chemistries  Recent Labs  Lab 11/04/18 0506  11/08/18 0327  NA 138   < > 135  K 4.1   < > 3.8  CL 112*   < > 102  CO2 21*   < > 25  GLUCOSE 90   < > 95  BUN 40*   < > 21  CREATININE 1.05   < > 0.83  CALCIUM 7.7*   < > 7.5*  MG 2.1  --   --    < > =  values in this interval not displayed.    Cardiac Enzymes No results for input(s): TROPONINI in the last 168 hours.  Microbiology Results  Results for orders placed or performed during the hospital encounter of 10/30/18  Blood Culture (routine x 2)     Status: None   Collection Time: 10/30/18  8:46 PM  Result Value Ref Range Status   Specimen Description BLOOD RIGHT ANTECUBITAL  Final   Special Requests   Final    BOTTLES DRAWN AEROBIC AND ANAEROBIC Blood Culture results may not be optimal due to an excessive volume of blood received in culture bottles   Culture   Final    NO GROWTH 5 DAYS Performed at [facility], [address] [place], [place] [address]    Report Status 11/04/2018 FINAL  Final  Blood Culture (routine x 2)     Status: None   Collection Time: 10/30/18  8:46 PM  Result Value Ref Range Status   Specimen Description BLOOD LEFT ANTECUBITAL  Final   Special Requests   Final    BOTTLES DRAWN AEROBIC AND ANAEROBIC Blood Culture adequate volume   Culture   Final    NO GROWTH 5 DAYS Performed at [facility], [address] [place], [place] [address]    Report Status 11/04/2018 FINAL  Final  Urine culture     Status: None   Collection Time: 10/30/18  9:15 PM  Result Value Ref Range Status   Specimen Description   Final    URINE, RANDOM Performed at [facility], [address] [place], [place] [address]    Special Requests   Final    NONE Performed at [facility], [address], [place], [place] [address]    Culture   Final    NO  GROWTH Performed at [facility], [address] [place], [place] [address]    Report Status 11/01/2018 FINAL  Final    RADIOLOGY:  Dg Chest 1 View  Result Date: 11/06/2018 CLINICAL DATA:  chf 63 y/o male pt w/ CHF and HTN. Current smoker. Sx Peripheral vascular intervention 10/31/2018. EXAM: CHEST  1 VIEW COMPARISON:  11/03/2018 FINDINGS: Stable cardiac silhouette. Diffuse interstitial and airspace disease unchanged from comparison exam. No focal consolidation. No pneumothorax. IMPRESSION: No change in diffuse interstitial and airspace opacities. Electronically Signed   By: Genevive Bi M.D.   On: 11/06/2018 06:24    EKG:   Orders placed or performed during the hospital encounter of 10/30/18  . EKG 12-Lead  . EKG 12-Lead  . EKG 12-Lead  . EKG 12-Lead      Management plans discussed with the patient, family and they are in agreement.  CODE STATUS:     Code Status Orders  (From admission, onward)         Start     Ordered   10/30/18 2345  Do not attempt resuscitation (DNR)  Continuous    Question Answer Comment  In the event of cardiac or respiratory ARREST Do not call a "code blue"   In the event of cardiac or respiratory ARREST Do not perform Intubation, CPR, defibrillation or ACLS   In the event of cardiac or respiratory ARREST Use medication by any route, position, wound care, and other measures to relive pain and suffering. May use oxygen, suction and manual treatment of airway obstruction as needed for comfort.      10/30/18 2344        Code Status History  This patient has a current code status but no historical code status.      TOTAL TIME TAKING CARE OF THIS PATIENT: 45  minutes.   Note: This dictation was prepared with Dragon dictation along with smaller phrase technology. Any transcriptional errors that result from this process are unintentional.   @M[facility] @  on 11/08/2018 at 10:45 AM  Between 7am to 6pm - Pager - [phone]  After 6pm go  to [email or web address] - password [email or web address] [facility]  [facility]  Office  [phone]  CC: Primary care physician; [name], [name], MD

## 2018-11-08 NOTE — Discharge Instructions (Signed)
You may shower as of Sunday.  Keep incision clean and dry.  Keep knee joint flexible to avoid contracture  Follow-up with vascular surgery Dr. Raliegh Scarlet weeks for staple removal and wound check Follow-up with primary care physician at the facility in 3 days

## 2018-11-08 NOTE — TOC Transition Note (Addendum)
Transition of Care [facility]) - CM/SW Discharge Note   Patient Details  Name: Douglas Jarvis MRN: [id number] Date of Birth: 1956-03-02  [facility]) CM/SW Contact:  Shade Flood, LCSW Phone Number: 11/08/2018, 12:20 PM   Clinical Narrative:    Pt stable for dc today per MD. Plan remains for transfer to [facility] for SNF rehab. Met with pt who remains in agreement with the plan. DC clinical sent via [employer]. Tammy at [facility] updated and she states that she will text once pt's room is ready. Updated pt's RN. Pt going to Room 101. Phone number for report is [phone]. Envelope prepared to send with pt. RN to call EMS when SNF room is ready.   Spoke with pt about disability application as MD had consulted LCSW about this. Pt stated that his brother spoke with someone this AM and is assisting pt in application process. Per pt, he does not need any further information from LCSW regarding this issue.  There are no other TOC needs for dc.    Final next level of care: Skilled Nursing Facility Barriers to Discharge: Barriers Resolved   Patient Goals and CMS Choice Patient states their goals for this hospitalization and ongoing recovery are:: improve walking CMS Medicare.gov Compare Post Acute Care list provided to:: Patient Choice offered to / list presented to : Patient  Discharge Placement PASRR number recieved: 11/07/18            Patient chooses bed at: Other - please specify in the comment section below:([facility]) Patient to be transferred to facility by: EMS Name of family member notified: Only notified pt, he stated he would call family Patient and family notified of of transfer: 11/08/18  Discharge Plan and Services                          Social Determinants of Health (SDOH) Interventions     Readmission Risk Interventions No flowsheet data found.

## 2018-11-08 NOTE — TOC Progression Note (Signed)
Transition of Care [facility]) - Progression Note    Patient Details  Name: Douglas Jarvis MRN: [id number] Date of Birth: 12/05/1955  Transition of Care [facility]) CM/SW Contact  Darleene Cleaver, [place] Phone Number: 11/08/2018, 5:55 PM  Clinical Narrative:     CSW was informed that [facility] did not want to accept patient because he was not tested for Covid 19 and SNF was concerned that patient needs to be tested because of needing to be on 6L of oxygen while at rest per today's documentation.  CSW contacted Tammy admissions coordinator at [facility], and she said that the nurse at SNF who received report was concerned that patient was showing signs of Covid 19 based on his symptoms at admission and currently being on high level of oxygen.  SNF requested Covid-19 test be completed to rule out Covid 19.  SNF also stated they would rather wait till tomorrow so they can have a 10L tank at SNF in case patient does need to be on a higher level of oxygen.  SNF reported that the tank will not be available till tomorrow.  CSW updated case manager, bedside nurse, and physician, patient should be able to discharge to SNF tomorrow pending results from the Covid-19 test.   Expected Discharge Plan: Skilled Nursing Facility Barriers to Discharge: Barriers Resolved  Expected Discharge Plan and Services Expected Discharge Plan: Skilled Nursing Facility       Living arrangements for the past 2 months: Single Family Home Expected Discharge Date: 11/08/18                         Social Determinants of Health (SDOH) Interventions    Readmission Risk Interventions Readmission Risk Prevention Plan 11/08/2018  Transportation Screening Complete  PCP or Specialist Appt within 5-7 Days Not Complete  Not Complete comments Pt discharging to SNF for rehab  Home Care Screening Complete  Medication Review (RN CM) Complete  Some recent data might be hidden

## 2018-11-08 NOTE — TOC Progression Note (Signed)
Transition of Care [facility]) - Progression Note    Patient Details  Name: Douglas Jarvis MRN: [id number] Date of Birth: May 26, 1956  [facility]) CM/SW Contact  York Spaniel, [place] Phone Number: 11/08/2018, 7:58 AM  Clinical Narrative:   CSW informed by Babette Relic [phone]) at [facility] this morning that they have received authorization. Tammy also stated that patient would need a nicotine patch ordered because they are a no smoking facility.     Expected Discharge Plan: Skilled Nursing Facility Barriers to Discharge: No SNF bed  Expected Discharge Plan and Services Expected Discharge Plan: Skilled Nursing Facility       Living arrangements for the past 2 months: Single Family Home                           Social Determinants of Health (SDOH) Interventions    Readmission Risk Interventions No flowsheet data found.

## 2018-11-09 NOTE — Progress Notes (Signed)
Douglas Jarvis to be D/C'd Douglas Jarvis per MD order.  Discussed prescriptions and follow up appointments with the patient. Prescriptions given to patient, medication list explained in detail. Pt verbalized understanding.  Allergies as of 11/09/2018      Reactions   Penicillins Shortness Of Breath   Sulfa Antibiotics Other (See Comments)   Unsure of reaction as "it has been so long"      Medication List    STOP taking these medications   carvedilol 12.5 MG tablet Commonly known as:  COREG   spironolactone 25 MG tablet Commonly known as:  ALDACTONE     TAKE these medications   acetaminophen 325 MG tablet Commonly known as:  TYLENOL Take 2 tablets (650 mg total) by mouth every 6 (six) hours as needed for mild pain (or Fever >/= 101).   ascorbic acid 250 MG tablet Commonly known as:  VITAMIN C Take 1 tablet (250 mg total) by mouth 2 (two) times daily.   aspirin 81 MG EC tablet Take 1 tablet (81 mg total) by mouth daily.   clopidogrel 75 MG tablet Commonly known as:  PLAVIX Take 1 tablet (75 mg total) by mouth daily.   Ensure Max Protein Liqd Take 330 mLs (11 oz total) by mouth 2 (two) times daily.   furosemide 40 MG tablet Commonly known as:  LASIX Take 1 tablet (40 mg total) by mouth daily. What changed:    medication strength  how much to take   ipratropium-albuterol 0.5-2.5 (3) MG/3ML Soln Commonly known as:  DUONEB Take 3 mLs by nebulization 2 times daily at 12 noon and 4 pm.   multivitamin-lutein Caps capsule Take 1 capsule by mouth daily.   nicotine 21 mg/24hr patch Commonly known as:  NICODERM CQ - dosed in mg/24 hours Place 1 patch (21 mg total) onto the skin daily.   ondansetron 4 MG tablet Commonly known as:  ZOFRAN Take 1 tablet (4 mg total) by mouth every 6 (six) hours as needed for nausea.   potassium chloride SA 20 MEQ tablet Commonly known as:  K-DUR Take 2 tablets (40 mEq total) by mouth daily.   sertraline 50 MG tablet Commonly  known as:  ZOLOFT Take 1 tablet (50 mg total) by mouth daily.   traMADol 50 MG tablet Commonly known as:  ULTRAM Take 1 tablet (50 mg total) by mouth every 6 (six) hours as needed for moderate pain or severe pain.       Vitals:   11/09/18 0353 11/09/18 1157  BP: 102/66 109/71  Pulse: 97 95  Resp: 18 18  Temp: 98.1 F (36.7 C) 98.6 F (37 C)  SpO2: 93% 93%    Skin clean, dry and intact without evidence of skin break down, no evidence of skin tears noted. IV catheter discontinued intact. Site without signs and symptoms of complications. Dressing and pressure applied. Pt denies pain at this time. No complaints noted.  An After Visit Summary was printed and given to the patient. Patient escorted via stretcher, and D/C to [facility], [place] by non-emergent EMS. Cecil Cobbs RN [facility] [address] Phone [phone]

## 2018-11-09 NOTE — Progress Notes (Signed)
[facility] [place] at [facility]   PATIENT NAME: Douglas PrinceWilliam Jarvis    MR#:  [id number]  DATE OF BIRTH:  October 03, 1955  SUBJECTIVE:   Dressing change done on 11/06/2018 by vascular surgery.   still on 3-1/2 L - 4 l of oxygen via nasal cannula could not wean down.  Not on home O2.   denies chest pain or wheezing  REVIEW OF SYSTEMS:    Review of Systems  Constitutional: Negative.  Negative for chills, fever and malaise/fatigue.  HENT: Negative.  Negative for ear discharge, ear pain, hearing loss, nosebleeds and sore throat.   Eyes: Negative.  Negative for blurred vision and pain.  Respiratory: Negative.  Negative for cough, hemoptysis, shortness of breath and wheezing.   Cardiovascular: Negative.  Negative for chest pain, palpitations and leg swelling.  Gastrointestinal: Negative.  Negative for abdominal pain, blood in stool, diarrhea, nausea and vomiting.  Genitourinary: Negative.  Negative for dysuria.  Musculoskeletal: Negative for back pain.          Skin: Negative.   Neurological: Negative for dizziness, tremors, speech change, focal weakness, seizures and headaches.  Endo/Heme/Allergies: Negative.  Does not bruise/bleed easily.  Psychiatric/Behavioral: Negative.  Negative for depression, hallucinations and suicidal ideas.      Tolerating Diet: yes     DRUG ALLERGIES:   Allergies  Allergen Reactions  . Penicillins Shortness Of Breath  . Sulfa Antibiotics Other (See Comments)    Unsure of reaction as "it has been so long"    VITALS:  Blood pressure 102/66, pulse 97, temperature 98.1 F (36.7 C), temperature source Oral, resp. rate 18, height 5\' 10"  (1.778 m), weight 78.9 kg, SpO2 93 %.  PHYSICAL EXAMINATION:  Constitutional: Appears moderate distress with dyspnea and agitation. HENT: Normocephalic. Marland Kitchen. Nonrebreather  eyes: Conjunctivae and EOM are normal. PERRLA, no scleral icterus.  Neck: Normal ROM. Neck supple. No JVD. No tracheal deviation. CVS:  RRR, S1/S2 +, no murmurs, no gallops, no carotid bruit.  Pulmonary: No respiratory effort with rhonchi bilaterally.    Abdominal: Soft. BS +,  no distension, tenderness, rebound or guarding.  Musculoskeletal: Right BKA Neuro: Alert.  Skin: Right BKA bandaged/covered   psychiatric: Pleasant    LABORATORY PANEL:   CBC Recent Labs  Lab 11/07/18 0402  WBC 8.5  HGB 9.9*  HCT 31.1*  PLT 386   ------------------------------------------------------------------------------------------------------------------  Chemistries  Recent Labs  Lab 11/04/18 0506  11/08/18 0327  NA 138   < > 135  K 4.1   < > 3.8  CL 112*   < > 102  CO2 21*   < > 25  GLUCOSE 90   < > 95  BUN 40*   < > 21  CREATININE 1.05   < > 0.83  CALCIUM 7.7*   < > 7.5*  MG 2.1  --   --    < > = values in this interval not displayed.   ------------------------------------------------------------------------------------------------------------------  Cardiac Enzymes No results for input(s): TROPONINI in the last 168 hours. ------------------------------------------------------------------------------------------------------------------  RADIOLOGY:  No results found.   ASSESSMENT AND PLAN:   63 year old male with tobacco dependence, chronic diastolic heart failure and peripheral arterial disease presented to the hospital due to right foot gangrene.  1.  PAD with ischemic right lower extremity:   Revascularization was attempted however this was unsuccessful.  He is not a candidate for bypass due to extent of disease.  He is postoperative  right lower extremity BKA. Continue Plavix Plan for SNF at  discharge after insurance approval.  [name] approved Xeroform and kerlex wrap daily dressing changes. Vascular surgery consultation appreciated.   2.  Acute hypoxic respiratory failure in the setting of progressive interstitial lung disease/pulmonary edema with acute on chronic diastolic heart failure: Wean oxygen  to room air as tolerated Continue Lasix 40 mg daily with potassium daily and continue to monitor intake and output with daily weight. Continue to monitor BMP He is still 3 and half liters- 4 l  of oxygen via nasal cannula.  Patient does not use oxygen at home we will wean him down as tolerated   3.  Suicidal attempt with overdose of Coreg prior to admission:  IVC discontinued as well as sitter by psychiatry  continue Zoloft Psychiatry consult appreciated. He DOES NOT NEED IM Psych admission   4.  Asymptomatic V. tach: No further episodes Discontinued telemetry    Management plans discussed with the patient  CODE STATUS: DNR  TOTAL TIME TAKING CARE OF THIS PATIENT: 32  minutes.     POSSIBLE D/C Monday to skilled nursing facility depending on insurance approval and Lisabeth Register M.D on 11/09/2018 at 10:30 AM  Between 7am to 6pm - Pager - [phone] After 6pm go to [email or web address] - password [email or web address]  [facility] [place] [facility]  Office  [phone]  CC: Primary care physician; [name], MD  Note: This dictation was prepared with Dragon dictation along with smaller phrase technology. Any transcriptional errors that result from this process are unintentional.

## 2018-11-09 NOTE — TOC Transition Note (Signed)
Transition of Care [facility]) - CM/SW Discharge Note   Patient Details  Name: Douglas Jarvis MRN: [id number] Date of Birth: [birth year]  Transition of Care [facility]) CM/SW Contact:  Elliot Gault, LCSW Phone Number: 11/09/2018, 10:49 AM   Clinical Narrative:    Pt did not dc yesterday due to SNF concerns about pt's O2 needs and request for Covid testing. Spoke with Tammy from [place] this AM to update that Covid test is negative. Per Tammy, they have arranged for O2 that can deliver up to 10L in the event that pt's O2 requirements increase. Per Tammy, they are ready to accept pt today.   Updated MD and pt's RN. Pt aware and remains in agreement with the plan. RN to call report and EMS. Phone number for report provided to RN.  Updated DC Summary sent electronically to [facility]. There are no other CSW needs for dc.   Final next level of care: Skilled Nursing Facility Barriers to Discharge: Barriers Resolved   Patient Goals and CMS Choice Patient states their goals for this hospitalization and ongoing recovery are:: improve walking CMS Medicare.gov Compare Post Acute Care list provided to:: Patient Choice offered to / list presented to : Patient  Discharge Placement PASRR number recieved: 11/07/18            Patient chooses bed at: Other - please specify in the comment section below:([facility]) Patient to be transferred to facility by: EMS Name of family member notified: Only notified pt, he stated he would call family Patient and family notified of of transfer: 11/08/18  Discharge Plan and Services                          Social Determinants of Health (SDOH) Interventions     Readmission Risk Interventions Readmission Risk Prevention Plan 11/08/2018  Transportation Screening Complete  PCP or Specialist Appt within 5-7 Days Not Complete  Not Complete comments Pt discharging to SNF for rehab  Home Care Screening Complete  Medication Review (RN CM)  Complete  Some recent data might be hidden

## 2018-11-09 NOTE — Discharge Summary (Signed)
[facility] [place] at [facility]   PATIENT NAME: Douglas Jarvis    MR#:  [id number]  DATE OF BIRTH:  [birth year]  DATE OF ADMISSION:  10/30/2018 ADMITTING PHYSICIAN: Enedina Finner, MD  DATE OF DISCHARGE:  11/09/18  PRIMARY CARE PHYSICIAN: [name], MD    ADMISSION DIAGNOSIS:  Acute respiratory failure with hypoxia (HCC) [J96.01] Gangrene of right foot (HCC) [I96]  DISCHARGE DIAGNOSIS:  Active Problems:   Gangrene of right foot (HCC) Acute respiratory failure with hypoxia-requiring 4 L of oxygen via nasal cannula Tobacco abuse disorder COVID- NEGATIVE SECONDARY DIAGNOSIS:   Past Medical History:  Diagnosis Date  . CHF (congestive heart failure) (HCC)   . Hypertension   . Renal disorder     HOSPITAL COURSE:  hpi  Nickolas Chalfin  is a 63 y.o. male with a known history of systolic mild congestive heart failure, hypertension comes to the emergency room when a family member notified his brother who came to check on patient found to have right foot gangrene/blackened foot. Patient was brought to the emergency room and was found to have right foot gangrene and congestive heart failure acute on chronic systolic. Patient has stopped taking his Coreg Lasix and spironolactone for last two weeks. He apparently had overdosed on his hold prescription of Coreg which patient reports because he is tired of his quality of life and lifestyle. He did not inform anyone of his overdosing. Patient currently is under IVC by ER physician. Psych consult has been placed.  X-ray of the right foot shows gangrene. Patient received IV vancomycin, Flagyl,cefepime ER.  His blood pressure was a bit on the softer side. Lasix 20 mg was started IV. Patient currently is on oxygen his sats are 94% nearby evaluation. He does not wear oxygen.He is a heavy smoker.   1.  PAD with ischemic right lower extremity:  Revascularization was attempted however this was unsuccessful.  He is not a  candidate for bypass due to extent of disease.  He is postoperative right lower extremity BKA. Continue Plavix Plan for SNF at discharge  today [name] approved Xeroform and kerlex wrap daily dressing changes. Vascular surgery consultation appreciated.  Follow-up with Dr. Wyn Quaker in 3 weeks after discharge for staple removal and wound check   2.  Acute hypoxic respiratory failure in the setting of progressive interstitial lung disease/pulmonary edema with acute on chronic diastolic heart failure: Wean oxygen to room air as tolerated Continue Lasix 40 mg daily with potassium daily and continue to monitor intake and output with daily weight. Coreg and spironolactone on hold in view of low blood pressure Continue to monitor BMP He is still  On 4 liters of oxygen via nasal cannula.  Patient does not use oxygen at home we will wean him down as tolerated COVID- NEGATIVE  3.  Suicidal attempt with overdose of Coreg prior to admission:  IVC discontinued as well as sitter by psychiatry  continue Zoloft Psychiatry consult appreciated. He DOES NOT NEED IM Psych admission   4.  Asymptomatic V. tach: No further episodes Discontinued telemetry    Management plans discussed with the patient  CODE STATUS: DNR  Disposition skilled nursing facility [facility]  DISCHARGE CONDITIONS:   STABLE  CONSULTS OBTAINED:  Treatment Team:  Bertram Denver, MD Lamar Blinks, MD Annice Needy, MD   PROCEDURES right BKA  DRUG ALLERGIES:   Allergies  Allergen Reactions  . Penicillins Shortness Of Breath  . Sulfa Antibiotics Other (  See Comments)    Unsure of reaction as "it has been so long"    DISCHARGE MEDICATIONS:   Allergies as of 11/09/2018      Reactions   Penicillins Shortness Of Breath   Sulfa Antibiotics Other (See Comments)   Unsure of reaction as "it has been so long"      Medication List    STOP taking these medications   carvedilol 12.5 MG  tablet Commonly known as:  COREG   spironolactone 25 MG tablet Commonly known as:  ALDACTONE     TAKE these medications   acetaminophen 325 MG tablet Commonly known as:  TYLENOL Take 2 tablets (650 mg total) by mouth every 6 (six) hours as needed for mild pain (or Fever >/= 101).   ascorbic acid 250 MG tablet Commonly known as:  VITAMIN C Take 1 tablet (250 mg total) by mouth 2 (two) times daily.   aspirin 81 MG EC tablet Take 1 tablet (81 mg total) by mouth daily.   clopidogrel 75 MG tablet Commonly known as:  PLAVIX Take 1 tablet (75 mg total) by mouth daily.   Ensure Max Protein Liqd Take 330 mLs (11 oz total) by mouth 2 (two) times daily.   furosemide 40 MG tablet Commonly known as:  LASIX Take 1 tablet (40 mg total) by mouth daily. What changed:    medication strength  how much to take   ipratropium-albuterol 0.5-2.5 (3) MG/3ML Soln Commonly known as:  DUONEB Take 3 mLs by nebulization 2 times daily at 12 noon and 4 pm.   multivitamin-lutein Caps capsule Take 1 capsule by mouth daily.   nicotine 21 mg/24hr patch Commonly known as:  NICODERM CQ - dosed in mg/24 hours Place 1 patch (21 mg total) onto the skin daily.   ondansetron 4 MG tablet Commonly known as:  ZOFRAN Take 1 tablet (4 mg total) by mouth every 6 (six) hours as needed for nausea.   potassium chloride SA 20 MEQ tablet Commonly known as:  K-DUR Take 2 tablets (40 mEq total) by mouth daily.   sertraline 50 MG tablet Commonly known as:  ZOLOFT Take 1 tablet (50 mg total) by mouth daily.   traMADol 50 MG tablet Commonly known as:  ULTRAM Take 1 tablet (50 mg total) by mouth every 6 (six) hours as needed for moderate pain or severe pain.        DISCHARGE INSTRUCTIONS:   Follow-up with vascular surgery Dr. Raliegh Scarlet weeks for staple removal and wound check Follow-up with primary care physician at the facility in 3 days  DIET:  Cardiac diet  DISCHARGE CONDITION:  Fair  ACTIVITY:   Activity as tolerated per PT   OXYGEN:  Home Oxygen: Yes.     Oxygen Delivery: 4 liters/min via Patient connected to nasal cannula oxygen  DISCHARGE LOCATION:  nursing home   If you experience worsening of your admission symptoms, develop shortness of breath, life threatening emergency, suicidal or homicidal thoughts you must seek medical attention immediately by calling 911 or calling your MD immediately  if symptoms less severe.  You Must read complete instructions/literature along with all the possible adverse reactions/side effects for all the Medicines you take and that have been prescribed to you. Take any new Medicines after you have completely understood and accpet all the possible adverse reactions/side effects.   Please note  You were cared for by a hospitalist during your hospital stay. If you have any questions about your discharge medications or the care  you received while you were in the hospital after you are discharged, you can call the unit and asked to speak with the hospitalist on call if the hospitalist that took care of you is not available. Once you are discharged, your primary care physician will handle any further medical issues. Please note that NO REFILLS for any discharge medications will be authorized once you are discharged, as it is imperative that you return to your primary care physician (or establish a relationship with a primary care physician if you do not have one) for your aftercare needs so that they can reassess your need for medications and monitor your lab values.     Today  Chief Complaint  Patient presents with  . Fever   Patient is feeling fine.  No overnight incidents He was smoking 1 and half pack a day prior to the admission and currently requiring 4 L of oxygen Doing fine and agreeable to rehab COVID- NEGATIVE  ROS:  CONSTITUTIONAL: Denies fevers, chills. Denies any fatigue, weakness.  EYES: Denies blurry vision, double vision, eye  pain. EARS, NOSE, THROAT: Denies tinnitus, ear pain, hearing loss. RESPIRATORY: Denies cough, wheeze, shortness of breath.  CARDIOVASCULAR: Denies chest pain, palpitations, edema.  GASTROINTESTINAL: Denies nausea, vomiting, diarrhea, abdominal pain. Denies bright red blood per rectum. GENITOURINARY: Denies dysuria, hematuria. ENDOCRINE: Denies nocturia or thyroid problems. HEMATOLOGIC AND LYMPHATIC: Denies easy bruising or bleeding. SKIN: Denies rash or lesion. MUSCULOSKELETAL: Denies pain in neck, back, shoulder, knees, hips or arthritic symptoms.  NEUROLOGIC: Denies paralysis, paresthesias.  PSYCHIATRIC: Denies anxiety or depressive symptoms.   VITAL SIGNS:  Blood pressure 102/66, pulse 97, temperature 98.1 F (36.7 C), temperature source Oral, resp. rate 18, height  (1.778 m), weight 78.9 kg, SpO2 93 %.  I/O:    Intake/Output Summary (Last 24 hours) at 11/09/2018 1026 Last data filed at 11/09/2018 0900 Gross per 24 hour  Intake 120 ml  Output 1000 ml  Net -880 ml    PHYSICAL EXAMINATION:  GENERAL:  63 y.o.-year-old patient lying in the bed with no acute distress.  EYES: Pupils equal, round, reactive to light and accommodation. No scleral icterus. Extraocular muscles intact.  HEENT: Head atraumatic, normocephalic. Oropharynx and nasopharynx clear.  NECK:  Supple, no jugular venous distention. No thyroid enlargement, no tenderness.  LUNGS: Normal breath sounds bilaterally, no wheezing, rales,rhonchi or crepitation. No use of accessory muscles of respiration.  CARDIOVASCULAR: S1, S2 normal. No murmurs, rubs, or gallops.  ABDOMEN: Soft, non-tender, non-distended. Bowel sounds present. EXTREMITIES: Right BKA clean dressing no pedal edema, cyanosis, or clubbing.  NEUROLOGIC: Awake, alert and oriented x3 sensation intact. Gait not checked.  PSYCHIATRIC: The patient is alert and oriented x 3.  SKIN: No obvious rash, lesion, or ulcer.   DATA REVIEW:   CBC Recent Labs  Lab  11/07/18 0402  WBC 8.5  HGB 9.9*  HCT 31.1*  PLT 386    Chemistries  Recent Labs  Lab 11/04/18 0506  11/08/18 0327  NA 138   < > 135  K 4.1   < > 3.8  CL 112*   < > 102  CO2 21*   < > 25  GLUCOSE 90   < > 95  BUN 40*   < > 21  CREATININE 1.05   < > 0.83  CALCIUM 7.7*   < > 7.5*  MG 2.1  --   --    < > = values in this interval not displayed.    Cardiac Enzymes  No results for input(s): TROPONINI in the last 168 hours.  Microbiology Results  Results for orders placed or performed during the hospital encounter of 10/30/18  Blood Culture (routine x 2)     Status: None   Collection Time: 10/30/18  8:46 PM  Result Value Ref Range Status   Specimen Description BLOOD RIGHT ANTECUBITAL  Final   Special Requests   Final    BOTTLES DRAWN AEROBIC AND ANAEROBIC Blood Culture results may not be optimal due to an excessive volume of blood received in culture bottles   Culture   Final    NO GROWTH 5 DAYS Performed at [facility], [address] [place], [place] [address]    Report Status 11/04/2018 FINAL  Final  Blood Culture (routine x 2)     Status: None   Collection Time: 10/30/18  8:46 PM  Result Value Ref Range Status   Specimen Description BLOOD LEFT ANTECUBITAL  Final   Special Requests   Final    BOTTLES DRAWN AEROBIC AND ANAEROBIC Blood Culture adequate volume   Culture   Final    NO GROWTH 5 DAYS Performed at [facility], [address] [place], [place] [address]    Report Status 11/04/2018 FINAL  Final  Urine culture     Status: None   Collection Time: 10/30/18  9:15 PM  Result Value Ref Range Status   Specimen Description   Final    URINE, RANDOM Performed at [facility], [address] [place], [place] [address]    Special Requests   Final    NONE Performed at [facility], [address] [place], [place] [address]    Culture   Final    NO GROWTH Performed at [facility], [address]  [place], [place] [address]    Report Status 11/01/2018 FINAL  Final  SARS Coronavirus 2 [facility](Hospital order, Performed in [facility] hospital lab)     Status: None   Collection Time: 11/08/18  5:12 PM  Result Value Ref Range Status   SARS Coronavirus 2 NEGATIVE NEGATIVE Final    Comment: (NOTE) If result is NEGATIVE SARS-CoV-2 target nucleic acids are NOT DETECTED. The SARS-CoV-2 RNA is generally detectable in upper and lower  respiratory specimens during the acute phase of infection. The lowest  concentration of SARS-CoV-2 viral copies this assay can detect is 250  copies / mL. A negative result does not preclude SARS-CoV-2 infection  and should not be used as the sole basis for treatment or other  patient management decisions.  A negative result may occur with  improper specimen collection / handling, submission of specimen other  than nasopharyngeal swab, presence of viral mutation(s) within the  areas targeted by this assay, and inadequate number of viral copies  (<250 copies / mL). A negative result must be combined with clinical  observations, patient history, and epidemiological information. If result is POSITIVE SARS-CoV-2 target nucleic acids are DETECTED. The SARS-CoV-2 RNA is generally detectable in upper and lower  respiratory specimens dur ing the acute phase of infection.  Positive  results are indicative of active infection with SARS-CoV-2.  Clinical  correlation with patient history and other diagnostic information is  necessary to determine patient infection status.  Positive results do  not rule out bacterial infection or co-infection with other viruses. If result is PRESUMPTIVE POSTIVE SARS-CoV-2 nucleic acids MAY BE PRESENT.   A presumptive positive result was obtained on the submitted specimen  and  confirmed on repeat testing.  While 2019 novel coronavirus  (SARS-CoV-2) nucleic acids may be present in the submitted sample  additional confirmatory testing may be  necessary for epidemiological  and / or clinical management purposes  to differentiate between  SARS-CoV-2 and other Sarbecovirus currently known to infect humans.  If clinically indicated additional testing with an alternate test  methodology [phone]) is advised. The SARS-CoV-2 RNA is generally  detectable in upper and lower respiratory sp ecimens during the acute  phase of infection. The expected result is Negative. Fact Sheet for Patients:  BoilerBrush.com.cy Fact Sheet for Healthcare Providers: https://pope.com/ This test is not yet approved or cleared by the [place] FDA and has been authorized for detection and/or diagnosis of SARS-CoV-2 by FDA under an Emergency Use Authorization (EUA).  This EUA will remain in effect (meaning this test can be used) for the duration of the COVID-19 declaration under Section 564(b)(1) of the Act, 21 U.S.C. section 360bbb-3(b)(1), unless the authorization is terminated or revoked sooner. Performed at [facility], [address], [place], [place] [address]     RADIOLOGY:  Dg Chest 1 View  Result Date: 11/06/2018 CLINICAL DATA:  chf 63 y/o male pt w/ CHF and HTN. Current smoker. Sx Peripheral vascular intervention 10/31/2018. EXAM: CHEST  1 VIEW COMPARISON:  11/03/2018 FINDINGS: Stable cardiac silhouette. Diffuse interstitial and airspace disease unchanged from comparison exam. No focal consolidation. No pneumothorax. IMPRESSION: No change in diffuse interstitial and airspace opacities. Electronically Signed   By: Genevive Bi M.D.   On: 11/06/2018 06:24    EKG:   Orders placed or performed during the hospital encounter of 10/30/18  . EKG 12-Lead  . EKG 12-Lead  . EKG 12-Lead  . EKG 12-Lead      Management plans discussed with the patient, he is in agreement.  CODE STATUS:     Code Status Orders  (From admission, onward)         Start     Ordered   10/30/18 2345   Do not attempt resuscitation (DNR)  Continuous    Question Answer Comment  In the event of cardiac or respiratory ARREST Do not call a "code blue"   In the event of cardiac or respiratory ARREST Do not perform Intubation, CPR, defibrillation or ACLS   In the event of cardiac or respiratory ARREST Use medication by any route, position, wound care, and other measures to relive pain and suffering. May use oxygen, suction and manual treatment of airway obstruction as needed for comfort.      10/30/18 2344        Code Status History    This patient has a current code status but no historical code status.      TOTAL TIME TAKING CARE OF THIS PATIENT: 45  minutes.   Note: This dictation was prepared with Dragon dictation along with smaller phrase technology. Any transcriptional errors that result from this process are unintentional.   @  on 11/09/2018 at 10:26 AM  Between 7am to 6pm - Pager - [phone]  After 6pm go to [email or web address] - password [email or web address]  [facility]  Office  [phone]  CC: Primary care physician; [name], MD

## 2018-11-14 ENCOUNTER — Telehealth (INDEPENDENT_AMBULATORY_CARE_PROVIDER_SITE_OTHER): Payer: Self-pay | Admitting: Vascular Surgery

## 2018-11-14 NOTE — Telephone Encounter (Signed)
Spoke with patient.  Patient is without complaint.  He has not been experiencing any postoperative complications.  He states that his stump is healing well.  He was working with physical therapy at the time of my phone call.  He has been afebrile.

## 2018-11-29 ENCOUNTER — Ambulatory Visit (INDEPENDENT_AMBULATORY_CARE_PROVIDER_SITE_OTHER): Payer: 59 | Admitting: Nurse Practitioner

## 2018-12-19 DEATH — deceased

## 2019-07-16 IMAGING — DX CHEST  1 VIEW
1 series · 1 of 1 positions shown · non-contrast
Comparison: Chest radiographs 01/31/2014 and 10/30/2018.

CLINICAL DATA: Low oxygen saturation. History of congestive heart
failure, hypertension and renal disorder. Recent foot gangrene.

EXAM:
CHEST  1 VIEW

[chest ap]
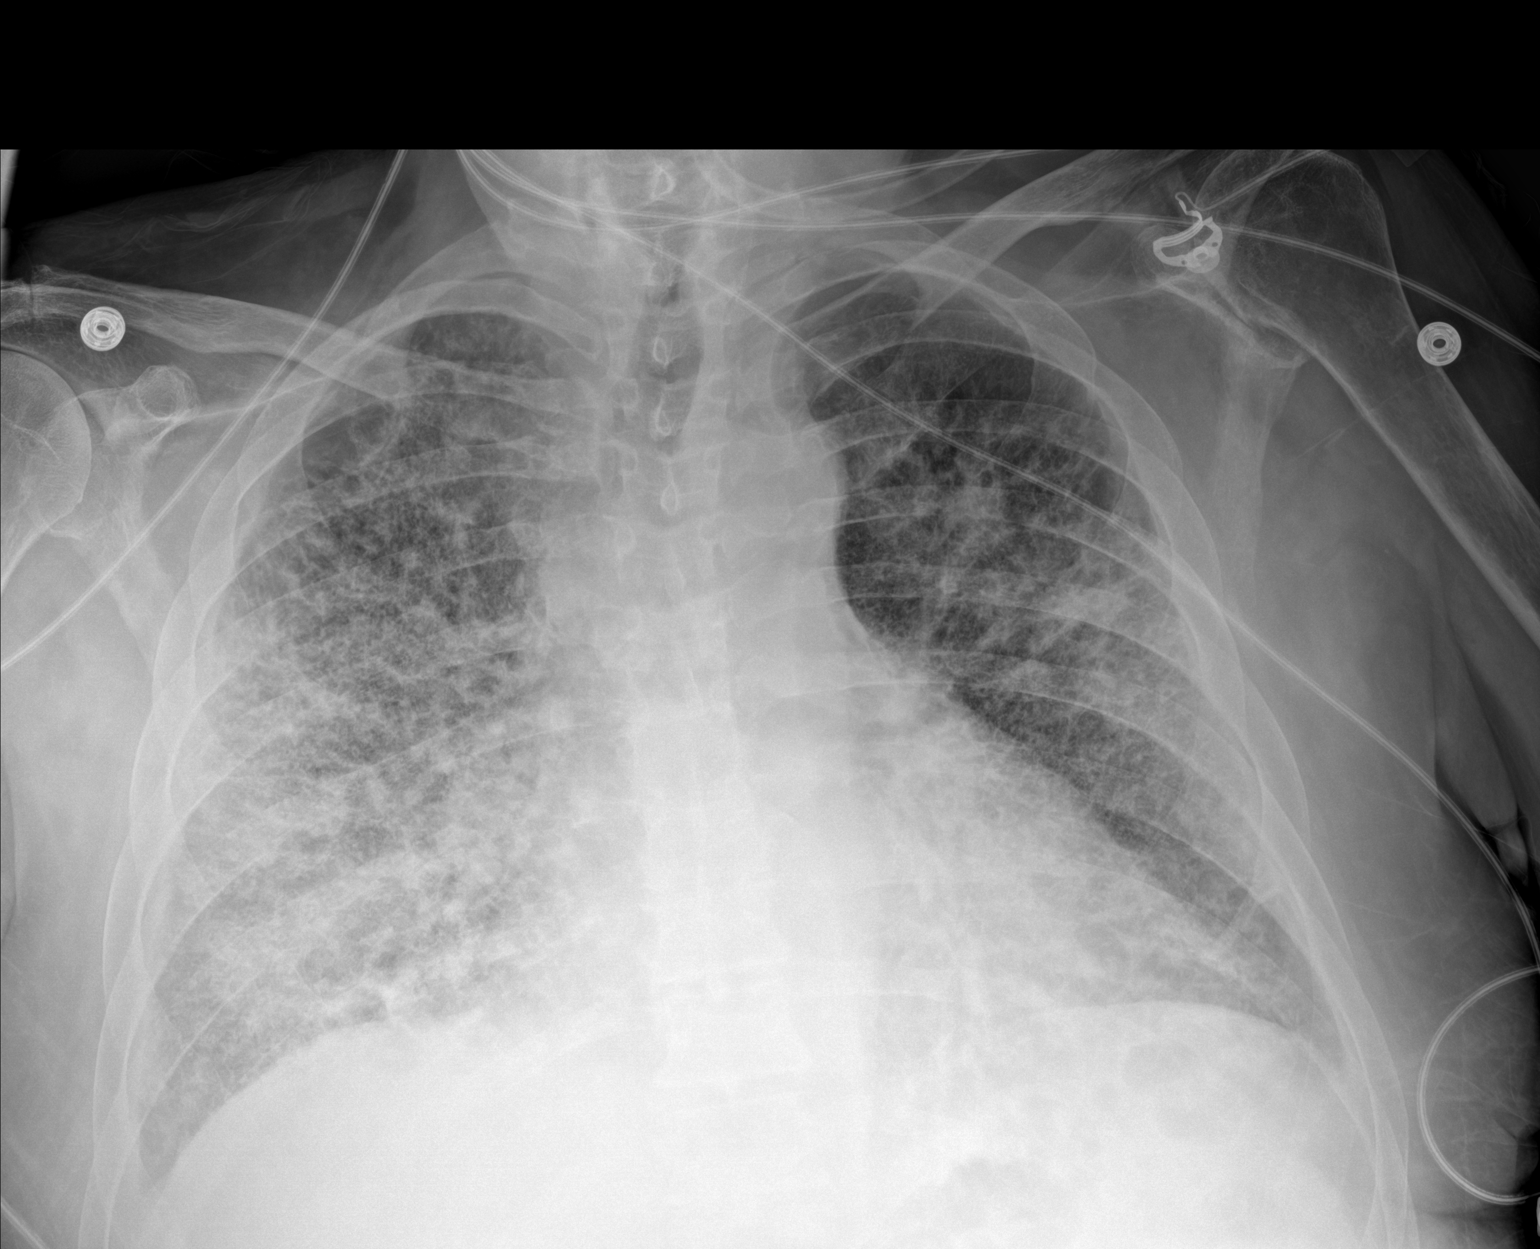

[1 of 1 positions shown; findings below may reference images not displayed]

FINDINGS: 2627 hours. The heart size and mediastinal contours are stable from
the recent study. Right paratracheal and right hilar adenopathy
difficult to exclude. There are slightly lower lung volumes which
may account for slightly increased opacity in the perihilar regions
compared with the recent prior study. Compared with the study from
8339, there are extensive interstitial and ground-glass opacities
throughout both lungs. There is no pleural effusion or pneumothorax.
The bones appear unchanged.
IMPRESSION: 1. Slight worsening in bilateral pulmonary opacities compared with
prior study from 3 days ago, possibly due to lower lung volumes.
Findings could be secondary to atypical infection superimposed on
underlying chronic interstitial lung disease. Other considerations
include edema and progressive underlying interstitial lung disease.
2. Continued chest radiographic follow up recommended. If the
opacities persist, follow-up high-resolution chest CT may be helpful
to assess for progressive interstitial lung disease.

## 2019-07-17 IMAGING — DX CHEST  1 VIEW
1 series · 1 of 1 positions shown · non-contrast
Comparison: 11/02/2018

CLINICAL DATA: Pneumonia

EXAM:
CHEST  1 VIEW

[chest ap]
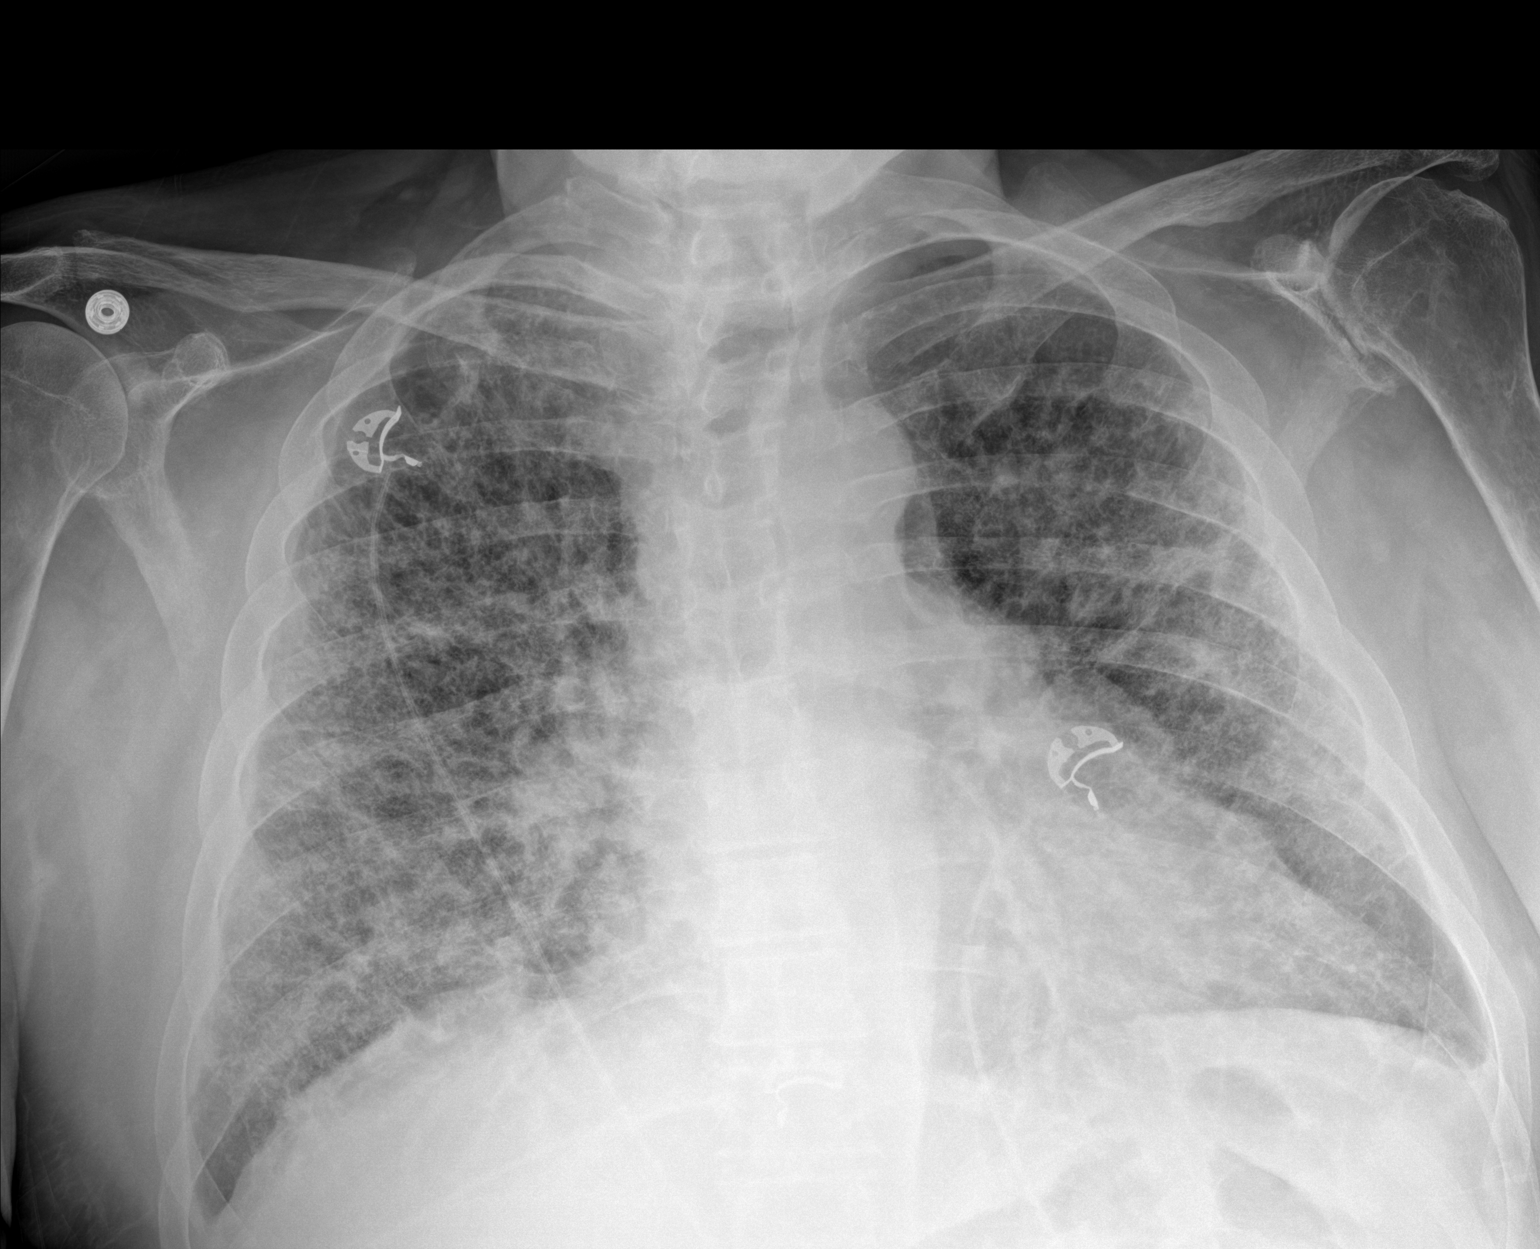

[1 of 1 positions shown; findings below may reference images not displayed]

FINDINGS: Diffuse bilateral airspace opacities are stable on the left and
improved in the right lung. Normal heart size. Low lung volumes. No
pneumothorax or pleural effusion.
IMPRESSION: Bilateral airspace opacities are stable on the left and improved on
the right.
